# Patient Record
Sex: Male | Born: 1975 | Race: White | Hispanic: No | Marital: Single | Smoking: Never smoker
Health system: Southern US, Community
[De-identification: ages and names within clinical notes are randomized; demographics above are authoritative.]

## PROBLEM LIST (undated history)

## (undated) DIAGNOSIS — G4730 Sleep apnea, unspecified: Secondary | ICD-10-CM

## (undated) DIAGNOSIS — I10 Essential (primary) hypertension: Secondary | ICD-10-CM

## (undated) HISTORY — DX: Essential (primary) hypertension: I10

---

## 2014-11-27 ENCOUNTER — Ambulatory Visit (INDEPENDENT_AMBULATORY_CARE_PROVIDER_SITE_OTHER): Payer: BLUE CROSS/BLUE SHIELD | Admitting: Physician Assistant

## 2014-11-27 VITALS — BP 120/78 | HR 76 | Temp 97.7°F | Resp 16 | Ht 70.0 in | Wt 186.0 lb

## 2014-11-27 DIAGNOSIS — M545 Low back pain, unspecified: Secondary | ICD-10-CM

## 2014-11-27 DIAGNOSIS — M549 Dorsalgia, unspecified: Secondary | ICD-10-CM | POA: Insufficient documentation

## 2014-11-27 MED ORDER — BACLOFEN 10 MG PO TABS: 10.0000 mg | ORAL_TABLET | Freq: Two times a day (BID) | ORAL | Status: DC

## 2014-11-27 MED ORDER — PREDNISONE 20 MG PO TABS: ORAL_TABLET | ORAL | Status: DC

## 2014-11-27 MED ORDER — HYDROCODONE-ACETAMINOPHEN 5-325 MG PO TABS: 1.0000 | ORAL_TABLET | Freq: Four times a day (QID) | ORAL | Status: DC | PRN

## 2014-11-27 NOTE — Progress Notes (Signed)
   Subjective:    Patient ID: Dale Conley, male    DOB: Jul 09, 1976, 39 y.o.   MRN: [id number]  HPI 39 y.o. with history of back pain presents with back pain has not been seen since 2014. He has been sleeping on the couch due to new baby, no injury, tight yesterday morning and then got worse at work. Mid back pain, across, Patient denies fever, hematuria, incontinence, numbness, tingling, weakness and saddle anesthesia  Blood pressure 120/78, pulse 76, temperature 97.7 F (36.5 C), resp. rate 16, height 5\' 10"  (1.778 m), weight 186 lb (84.369 kg).  Review of Systems  Constitutional: Negative.  Negative for fever.  HENT: Negative.   Respiratory: Negative.   Cardiovascular: Negative.  Negative for chest pain.  Gastrointestinal: Negative.  Negative for abdominal pain.  Genitourinary: Negative.  Negative for dysuria.  Musculoskeletal: Positive for back pain and gait problem. Negative for myalgias, joint swelling, arthralgias, neck pain and neck stiffness.  Skin: Negative.   Neurological: Negative.  Negative for weakness, numbness and headaches.       Objective:   Physical Exam  Constitutional: He is oriented to person, place, and time. He appears well-developed and well-nourished.  Cardiovascular: Normal rate and regular rhythm.   Pulmonary/Chest: Effort normal and breath sounds normal.  Abdominal: Soft. Bowel sounds are normal.  Musculoskeletal:  Patient is able to ambulate well.   Gait is  antalgic. Straight leg raising with dorsiflexion negative bilaterally for radicular symptoms. Sensory exam in the legs are normal.  Knee reflexes are normal Ankle reflexes are normal Strength is normal and symmetric in arms and legs. There is not SI tenderness to palpation.  There isparaspinal muscle spasm.   There is midline tenderness.   ROM of spine with normal flexion, extension, lateral range of motion to the right and left, and rotation to the right and left.    Neurological: He is  alert and oriented to person, place, and time.       Assessment & Plan:  Lower back pain- negative straight leg Prednisone was prescribed,NSAIDs, RICE, and exercise given Will do baclofen, Norco 5 325, #60 NR If not better follow up in office or will refer to PT/orthopedics.  Schedule CPE in near future

## 2014-11-27 NOTE — Patient Instructions (Signed)
Back Injury Prevention Back injuries can be extremely painful and difficult to heal. After having one back injury, you are much more likely to experience another later on. It is important to learn how to avoid injuring or re-injuring your back. The following tips can help you to prevent a back injury. PHYSICAL FITNESS  Exercise regularly and try to develop good tone in your abdominal muscles. Your abdominal muscles provide a lot of the support needed by your back.  Do aerobic exercises (walking, jogging, biking, swimming) regularly.  Do exercises that increase balance and strength (tai chi, yoga) regularly. This can decrease your risk of falling and injuring your back.  Stretch before and after exercising.  Maintain a healthy weight. The more you weigh, the more stress is placed on your back. For every pound of weight, 10 times that amount of pressure is placed on the back. DIET  Talk to your caregiver about how much calcium and vitamin D you need per day. These nutrients help to prevent weakening of the bones (osteoporosis). Osteoporosis can cause broken (fractured) bones that lead to back pain.  Include good sources of calcium in your diet, such as dairy products, green, leafy vegetables, and products with calcium added (fortified).  Include good sources of vitamin D in your diet, such as milk and foods that are fortified with vitamin D.  Consider taking a nutritional supplement or a multivitamin if needed.  Stop smoking if you smoke. POSTURE  Sit and stand up straight. Avoid leaning forward when you sit or hunching over when you stand.  Choose chairs with good low back (lumbar) support.  If you work at a desk, sit close to your work so you do not need to lean over. Keep your chin tucked in. Keep your neck drawn back and elbows bent at a right angle. Your arms should look like the letter "L."  Sit high and close to the steering wheel when you drive. Add a lumbar support to your car  seat if needed.  Avoid sitting or standing in one position for too long. Take breaks to get up, stretch, and walk around at least once every hour. Take breaks if you are driving for long periods of time.  Sleep on your side with your knees slightly bent, or sleep on your back with a pillow under your knees. Do not sleep on your stomach. LIFTING, TWISTING, AND REACHING  Avoid heavy lifting, especially repetitive lifting. If you must do heavy lifting:  Stretch before lifting.  Work slowly.  Rest between lifts.  Use carts and dollies to move objects when possible.  Make several small trips instead of carrying 1 heavy load.  Ask for help when you need it.  Ask for help when moving big, awkward objects.  Follow these steps when lifting:  Stand with your feet shoulder-width apart.  Get as close to the object as you can. Do not try to pick up heavy objects that are far from your body.  Use handles or lifting straps if they are available.  Bend at your knees. Squat down, but keep your heels off the floor.  Keep your shoulders pulled back, your chin tucked in, and your back straight.  Lift the object slowly, tightening the muscles in your legs, abdomen, and buttocks. Keep the object as close to the center of your body as possible.  When you put a load down, use these same guidelines in reverse.  Do not:  Lift the object above your waist.    Twist at the waist while lifting or carrying a load. Move your feet if you need to turn, not your waist.  Bend over without bending at your knees.  Avoid reaching over your head, across a table, or for an object on a high surface. OTHER TIPS  Avoid wet floors and keep sidewalks clear of ice to prevent falls.  Do not sleep on a mattress that is too soft or too hard.  Keep items that are used frequently within easy reach.  Put heavier objects on shelves at waist level and lighter objects on lower or higher shelves.  Find ways to  decrease your stress, such as exercise, massage, or relaxation techniques. Stress can build up in your muscles. Tense muscles are more vulnerable to injury.  Seek treatment for depression or anxiety if needed. These conditions can increase your risk of developing back pain. SEEK MEDICAL CARE IF:  You injure your back.  You have questions about diet, exercise, or other ways to prevent back injuries. MAKE SURE YOU:  Understand these instructions.  Will watch your condition.  Will get help right away if you are not doing well or get worse. Document Released: 10/22/2004 Document Revised: 12/07/2011 Document Reviewed: 10/26/2011 [facility] Patient Information 2015 [place], [place]. This information is not intended to replace advice given to you by your health care provider. Make sure you discuss any questions you have with your health care provider. Back Exercises Back exercises help treat and prevent back injuries. The goal of back exercises is to increase the strength of your abdominal and back muscles and the flexibility of your back. These exercises should be started when you no longer have back pain. Back exercises include:  Pelvic Tilt. Lie on your back with your knees bent. Tilt your pelvis until the lower part of your back is against the floor. Hold this position 5 to 10 sec and repeat 5 to 10 times.  Knee to Chest. Pull first 1 knee up against your chest and hold for 20 to 30 seconds, repeat this with the other knee, and then both knees. This may be done with the other leg straight or bent, whichever feels better.  Sit-Ups or Curl-Ups. Bend your knees 90 degrees. Start with tilting your pelvis, and do a partial, slow sit-up, lifting your trunk only 30 to 45 degrees off the floor. Take at least 2 to 3 seconds for each sit-up. Do not do sit-ups with your knees out straight. If partial sit-ups are difficult, simply do the above but with only tightening your abdominal muscles and holding it as  directed.  Hip-Lift. Lie on your back with your knees flexed 90 degrees. Push down with your feet and shoulders as you raise your hips a couple inches off the floor; hold for 10 seconds, repeat 5 to 10 times.  Back arches. Lie on your stomach, propping yourself up on bent elbows. Slowly press on your hands, causing an arch in your low back. Repeat 3 to 5 times. Any initial stiffness and discomfort should lessen with repetition over time.  Shoulder-Lifts. Lie face down with arms beside your body. Keep hips and torso pressed to floor as you slowly lift your head and shoulders off the floor. Do not overdo your exercises, especially in the beginning. Exercises may cause you some mild back discomfort which lasts for a few minutes; however, if the pain is more severe, or lasts for more than 15 minutes, do not continue exercises until you see your caregiver. Improvement with  exercise therapy for back problems is slow.  See your caregivers for assistance with developing a proper back exercise program. Document Released: 10/22/2004 Document Revised: 12/07/2011 Document Reviewed: 07/16/2011 [facility] Patient Information 2015 [place], [place]. This information is not intended to replace advice given to you by your health care provider. Make sure you discuss any questions you have with your health care provider.

## 2015-05-01 ENCOUNTER — Ambulatory Visit (INDEPENDENT_AMBULATORY_CARE_PROVIDER_SITE_OTHER): Payer: BLUE CROSS/BLUE SHIELD | Admitting: Physician Assistant

## 2015-05-01 ENCOUNTER — Encounter: Payer: Self-pay | Admitting: Physician Assistant

## 2015-05-01 VITALS — BP 146/98 | HR 88 | Temp 97.7°F | Resp 16 | Ht 69.0 in | Wt 191.4 lb

## 2015-05-01 DIAGNOSIS — R5383 Other fatigue: Secondary | ICD-10-CM

## 2015-05-01 DIAGNOSIS — I10 Essential (primary) hypertension: Secondary | ICD-10-CM | POA: Diagnosis not present

## 2015-05-01 DIAGNOSIS — Z0000 Encounter for general adult medical examination without abnormal findings: Secondary | ICD-10-CM

## 2015-05-01 DIAGNOSIS — Z79899 Other long term (current) drug therapy: Secondary | ICD-10-CM | POA: Diagnosis not present

## 2015-05-01 DIAGNOSIS — E559 Vitamin D deficiency, unspecified: Secondary | ICD-10-CM

## 2015-05-01 DIAGNOSIS — M545 Low back pain, unspecified: Secondary | ICD-10-CM

## 2015-05-01 DIAGNOSIS — M26609 Unspecified temporomandibular joint disorder, unspecified side: Secondary | ICD-10-CM

## 2015-05-01 LAB — CBC WITH DIFFERENTIAL/PLATELET
Basophils Absolute: 0.1 10*3/uL (ref 0.0–0.1)
Basophils Relative: 1 % (ref 0–1)
EOS PCT: 3 % (ref 0–5)
Eosinophils Absolute: 0.2 10*3/uL (ref 0.0–0.7)
HEMATOCRIT: 43.1 % (ref 39.0–52.0)
HEMOGLOBIN: 14.7 g/dL (ref 13.0–17.0)
Lymphocytes Relative: 43 % (ref 12–46)
Lymphs Abs: 3.6 10*3/uL (ref 0.7–4.0)
MCH: 31.5 pg (ref 26.0–34.0)
MCHC: 34.1 g/dL (ref 30.0–36.0)
MCV: 92.3 fL (ref 78.0–100.0)
MONOS PCT: 7 % (ref 3–12)
MPV: 8.8 fL (ref 8.6–12.4)
Monocytes Absolute: 0.6 10*3/uL (ref 0.1–1.0)
Neutro Abs: 3.8 10*3/uL (ref 1.7–7.7)
Neutrophils Relative %: 46 % (ref 43–77)
Platelets: 249 10*3/uL (ref 150–400)
RBC: 4.67 MIL/uL (ref 4.22–5.81)
RDW: 12.8 % (ref 11.5–15.5)
WBC: 8.3 10*3/uL (ref 4.0–10.5)

## 2015-05-01 LAB — HEMOGLOBIN A1C
HEMOGLOBIN A1C: 5.5 % (ref <5.7)
MEAN PLASMA GLUCOSE: 111 mg/dL (ref <117)

## 2015-05-01 MED ORDER — TRAMADOL HCL 50 MG PO TABS: ORAL_TABLET | ORAL | Status: DC

## 2015-05-01 NOTE — Progress Notes (Signed)
Complete Physical  Assessment and Plan: 1. Routine general medical examination at a health care facility - CBC with Differential/Platelet - BASIC METABOLIC PANEL WITH GFR - Hepatic function panel - TSH - Lipid panel - Hemoglobin A1c - Insulin, fasting - Magnesium - Vit D  25 hydroxy (rtn osteoporosis monitoring) - Urinalysis, Routine w reflex microscopic (not at [facility]) - Microalbumin / creatinine urine ratio - Iron and TIBC - Ferritin - Vitamin B12 - Testosterone - EKG 12-Lead  2. Essential hypertension - monitor at home, recheck was 140/90, call the office if continually above 140/90, will follow up 1 month. DASH diet, exercise and monitor at home.  - CBC with Differential/Platelet - BASIC METABOLIC PANEL WITH GFR - Hepatic function panel - Lipid panel - Urinalysis, Routine w reflex microscopic (not at [facility]) - Microalbumin / creatinine urine ratio - EKG 12-Lead  3. Vitamin D deficiency - Vit D  25 hydroxy (rtn osteoporosis monitoring)  4. Medication management - Magnesium  5. Other fatigue Fatigue- Discussed lifestyle modification as means of resolving problem, Training modifications discussed, See orders for lab evaluation, Discussed how depression can be a cause of fatigue if all labs are negative discussed depression treatment, will start on celexa  if labs negative, follow up 1 month - TSH - Hemoglobin A1c - Insulin, fasting - Iron and TIBC - Ferritin - Vitamin B12 - Testosterone  6. Bilateral low back pain without sciatica Suggest PT - traMADol (ULTRAM) 50 MG tablet; 1 pill twice daily as needed for pain  Dispense: 60 tablet; Refill: 0  7. TMJ (temporomandibular joint syndrome) information given to the patient, no gum/decrease hard foods, warm wet wash clothes, decrease stress, talk with dentist about possible night guard, can do massage, and exercise.   Discussed med's effects and SE's. Screening labs and tests as requested with regular follow-up as  recommended. Over 40 minutes of exam, counseling, chart review and critical decision making was performed   HPI  This very nice 39 y.o.male presents for complete physical.  Patient has no major health issues.   He does not workout anymore due to heat and new baby. He has a history of elevated BP, worse when he comes to the doctors, has checked at home and it is okay, BP today is BP: (!) 146/98 mmHg  Finally, patient has history of Vitamin D Deficiency and last vitamin D was  Currently on supplementation.  Married with new baby girl, she is 50 months old, [place], [place]. Works for [employer], travels a lot, is in the car a lot.  Continuing lower back occ, has seen chiropractor which helped some.  He has been having fatigue x 1 year, worse with birth of his child. He sleeps 5-6 hours a night and wakes up frequently in the night, for last few years. Admits that he has been more anxious/depressed recently. He works, married, in school for [employer], and new baby.  BMI is Body mass index is 28.25 kg/(m^2)., he is working on diet and exercise. Wt Readings from Last 3 Encounters:  05/01/15 191 lb 6.4 oz (86.818 kg)  11/27/14 186 lb (84.369 kg)    Current Medications:  Current Outpatient Prescriptions on File Prior to Visit  Medication Sig Dispense Refill  . baclofen (LIORESAL) 10 MG tablet Take 1 tablet (10 mg total) by mouth 2 (two) times daily. 60 tablet 0   No current facility-administered medications on file prior to visit.   Health Maintenance:   TD/TDAP: 2015 Influenza: 2014 Pneumovax: N/A Prevnar  13: N/A  Sexually Active: yes, wife  Last Dental Exam: Dr. Ninetta Lights, q 6 months Last Eye Exam: None  Allergies:  Allergies  Allergen Reactions  . Flexeril [Cyclobenzaprine]    Medical History:  Past Medical History  Diagnosis Date  . Hypertension    Surgical History: No past surgical history on file. Family History:  Family History  Problem Relation Age of Onset  . Heart  disease Father 48    CABG  . Hypertension Father   . Hyperlipidemia Father   . Diabetes Paternal Grandfather    Social History:  History  Substance Use Topics  . Smoking status: Never Smoker   . Smokeless tobacco: Never Used  . Alcohol Use: 8.4 oz/week    14 Standard drinks or equivalent per week     Comment: beer   Review of Systems: Review of Systems  Constitutional: Positive for malaise/fatigue. Negative for fever, chills, weight loss and diaphoresis.  HENT: Negative for congestion, ear discharge, ear pain, hearing loss, nosebleeds, sore throat and tinnitus.   Eyes: Negative.   Respiratory: Negative.  Negative for stridor.   Cardiovascular: Negative.   Gastrointestinal: Positive for heartburn. Negative for nausea, vomiting, abdominal pain, diarrhea, constipation, blood in stool and melena.  Genitourinary: Negative.   Musculoskeletal: Positive for back pain. Negative for myalgias, joint pain, falls and neck pain.  Skin: Negative.   Neurological: Positive for headaches (has TMJ, needs mouth guard). Negative for dizziness, tingling, tremors, sensory change, speech change, focal weakness, seizures, loss of consciousness and weakness.  Endo/Heme/Allergies: Negative.   Psychiatric/Behavioral: Positive for depression. Negative for suicidal ideas, hallucinations, memory loss and substance abuse. The patient is nervous/anxious and has insomnia.    Physical Exam: Estimated body mass index is 28.25 kg/(m^2) as calculated from the following:   Height as of this encounter: 5\' 9"  (1.753 m).   Weight as of this encounter: 191 lb 6.4 oz (86.818 kg). BP 146/98 mmHg  Pulse 88  Temp(Src) 97.7 F (36.5 C)  Resp 16  Ht 5\' 9"  (1.753 m)  Wt 191 lb 6.4 oz (86.818 kg)  BMI 28.25 kg/m2 General Appearance: Well nourished, in no apparent distress.  Eyes: PERRLA, EOMs, conjunctiva no swelling or erythema, normal fundi and vessels.  Sinuses: No Frontal/maxillary tenderness  ENT/Mouth: Ext aud  canals clear, normal light reflex with TMs without erythema, bulging. Good dentition. No erythema, swelling, or exudate on post pharynx. Tonsils not swollen or erythematous. Hearing normal.  Neck: Supple, thyroid normal. No bruits  Respiratory: Respiratory effort normal, BS equal bilaterally without rales, rhonchi, wheezing or stridor.  Cardio: RRR without murmurs, rubs or gallops. Brisk peripheral pulses without edema.  Chest: symmetric, with normal excursions and percussion.  Abdomen: Soft, nontender, no guarding, rebound, hernias, masses, or organomegaly.  Lymphatics: Non tender without lymphadenopathy.  Genitourinary: defer Musculoskeletal: Full ROM all peripheral extremities,5/5 strength, and normal gait.  Skin: Warm, dry without rashes, lesions, ecchymosis. Neuro: Cranial nerves intact, reflexes equal bilaterally. Normal muscle tone, no cerebellar symptoms. Sensation intact.  Psych: Awake and oriented X 3, normal affect, Insight and Judgment appropriate.   EKG: WNL  Quentin Mulling 2:41 PM [facility]

## 2015-05-01 NOTE — Patient Instructions (Addendum)
9 Ways to Naturally Increase Testosterone Levels  1.   Lose Weight If you're overweight, shedding the excess pounds may increase your testosterone levels, according to research presented at the [employer] Society's 2012 meeting. Overweight men are more likely to have low testosterone levels to begin with, so this is an important trick to increase your body's testosterone production when you need it most.  2.   High-Intensity Exercise like Peak [employer]itness  Short intense exercise has a proven positive effect on increasing testosterone levels and preventing its decline. That's unlike aerobics or prolonged moderate exercise, which have shown to have negative or no effect on testosterone levels. Having a whey protein meal after exercise can further enhance the satiety/testosterone-boosting impact (hunger hormones cause the opposite effect on your testosterone and libido). Here's a summary of what a typical high-intensity [employer]itness routine might look like: " Warm up for three minutes  " Exercise as hard and fast as you can for 30 seconds. You should feel like you couldn't possibly go on another few seconds  " Recover at a slow to moderate pace for 90 seconds  " Repeat the high intensity exercise and recovery 7 more times .  3.   Consume Plenty of Zinc The mineral zinc is important for testosterone production, and supplementing your diet for as little as six weeks has been shown to cause a marked improvement in testosterone among men with low levels.1 Likewise, research has shown that restricting dietary sources of zinc leads to a significant decrease in testosterone, while zinc supplementation increases it2 -- and even protects men from exercised-induced reductions in testosterone levels.3 It's estimated that up to 45 percent of adults over the age of 60 may have lower than recommended zinc intakes; even when dietary supplements were added in, an estimated 20-25 percent of older adults still had inadequate  zinc intakes, according to a National Health and Nutrition Examination Survey.4 Your diet is the best source of zinc; along with protein-rich foods like meats and fish, other good dietary sources of zinc include raw milk, raw cheese, beans, and yogurt or kefir made from raw milk. It can be difficult to obtain enough dietary zinc if you're a vegetarian, and also for meat-eaters as well, largely because of conventional farming methods that rely heavily on chemical fertilizers and pesticides. These chemicals deplete the soil of nutrients ... nutrients like zinc that must be absorbed by plants in order to be passed on to you. In many cases, you may further deplete the nutrients in your food by the way you prepare it. For most food, cooking it will drastically reduce its levels of nutrients like zinc . particularly over-cooking, which many people do. If you decide to use a zinc supplement, stick to a dosage of less than 40 mg a day, as this is the recommended adult upper limit. Taking too much zinc can interfere with your body's ability to absorb other minerals, especially copper, and may cause nausea as a side effect.  4.   Strength Training In addition to [employer], strength training is also known to boost testosterone levels, provided you are doing so intensely enough. When strength training to boost testosterone, you'll want to increase the weight and lower your number of reps, and then focus on exercises that work a large number of muscles, such as dead lifts or squats.  You can "turbo-charge" your weight training by going slower. By slowing down your movement, you're actually turning it into a high-intensity exercise. Super Slow   movement allows your muscle, at the microscopic level, to access the maximum number of cross-bridges between the protein filaments that produce movement in the muscle.   5.   Optimize Your Vitamin D Levels Vitamin D, a steroid hormone, is essential for the healthy development  of the nucleus of the sperm cell, and helps maintain semen quality and sperm count. Vitamin D also increases levels of testosterone, which may boost libido. In one study, overweight men who were given vitamin D supplements had a significant increase in testosterone levels after one year.5   6.   Reduce Stress When you're under a lot of stress, your body releases high levels of the stress hormone cortisol. This hormone actually blocks the effects of testosterone,6 presumably because, from a biological standpoint, testosterone-associated behaviors (mating, competing, aggression) may have lowered your chances of survival in an emergency (hence, the "fight or flight" response is dominant, courtesy of cortisol).  7.   Limit or Eliminate Sugar from Your Diet Testosterone levels decrease after you eat sugar, which is likely because the sugar leads to a high insulin level, another factor leading to low testosterone.7 Based on USDA estimates, the average American consumes 12 teaspoons of sugar a day, which equates to about TWO TONS of sugar during a lifetime.  8.   Eat Healthy Fats By healthy, this means not only mon- and polyunsaturated fats, like that found in avocadoes and nuts, but also saturated, as these are essential for building testosterone. Research shows that a diet with less than 40 percent of energy as fat (and that mainly from animal sources, i.e. saturated) lead to a decrease in testosterone levels.8 My personal diet is about 60-70 percent healthy fat, and other experts agree that the ideal diet includes somewhere between 50-70 percent fat.  It's important to understand that your body requires saturated fats from animal and vegetable sources (such as meat, dairy, certain oils, and tropical plants like coconut) for optimal functioning, and if you neglect this important food group in favor of sugar, grains and other starchy carbs, your health and weight are almost guaranteed to suffer. Examples of  healthy fats you can eat more of to give your testosterone levels a boost include: Olives and Olive oil  Coconuts and coconut oil Butter made from raw grass-fed organic milk Raw nuts, such as, almonds or pecans Organic pastured egg yolks Avocados Grass-fed meats Palm oil Unheated organic nut oils   9.   Boost Your Intake of Branch Chain Amino Acids (BCAA) from Foods Like Whey Protein Research suggests that BCAAs result in higher testosterone levels, particularly when taken along with resistance training.9 While BCAAs are available in supplement form, you'll find the highest concentrations of BCAAs like leucine in dairy products - especially quality cheeses and whey protein. Even when getting leucine from your natural food supply, it's often wasted or used as a building block instead of an anabolic agent. So to create the correct anabolic environment, you need to boost leucine consumption way beyond mere maintenance levels. That said, keep in mind that using leucine as a free form amino acid can be highly counterproductive as when free form amino acids are artificially administrated, they rapidly enter your circulation while disrupting insulin function, and impairing your body's glycemic control. Food-based leucine is really the ideal form that can benefit your muscles without side effects.   What is the TMJ? The temporomandibular (tem-PUH-ro-man-DIB-yoo-ler) joint, or the TMJ, connects the upper and lower jawbones. This joint allows the jaw to open  wide and move back and forth when you chew, talk, or yawn.There are also several muscles that help this joint move. There can be muscle tightness and pain in the muscle that can cause several symptoms.  What causes TMJ pain? There are many causes of TMJ pain. Repeated chewing (for example, chewing gum) and clenching your teeth can cause pain in the joint. Some TMJ pain has no obvious cause. What can I do to ease the pain? There are many things you can do  to help your pain get better. When you have pain:  Eat soft foods and stay away from chewy foods (for example, taffy) Try to use both sides of your mouth to chew Don't chew gum Massage Don't open your mouth wide (for example, during yawning or singing) Don't bite your cheeks or fingernails Lower your amount of stress and worry Applying a warm, damp washcloth to the joint may help. Over-the-counter pain medicines such as ibuprofen (one brand: Advil) or acetaminophen (one brand: Tylenol) might also help. Do not use these medicines if you are allergic to them or if your doctor told you not to use them. How can I stop the pain from coming back? When your pain is better, you can do these exercises to make your muscles stronger and to keep the pain from coming back:  Resisted mouth opening: Place your thumb or two fingers under your chin and open your mouth slowly, pushing up lightly on your chin with your thumb. Hold for three to six seconds. Close your mouth slowly. Resisted mouth closing: Place your thumbs under your chin and your two index fingers on the ridge between your mouth and the bottom of your chin. Push down lightly on your chin as you close your mouth. Tongue up: Slowly open and close your mouth while keeping the tongue touching the roof of the mouth. Side-to-side jaw movement: Place an object about one fourth of an inch thick (for example, two tongue depressors) between your front teeth. Slowly move your jaw from side to side. Increase the thickness of the object as the exercise becomes easier Forward jaw movement: Place an object about one fourth of an inch thick between your front teeth and move the bottom jaw forward so that the bottom teeth are in front of the top teeth. Increase the thickness of the object as the exercise becomes easier. These exercises should not be painful. If it hurts to do these exercises, stop doing them and talk to your family doctor.    Monitor your blood  pressure at home, if it is above 140/90 consistently call the office so we can adjust your medications. Go to the ER if any CP, SOB, nausea, dizziness, severe HA, changes vision/speech  DASH Eating Plan DASH stands for "Dietary Approaches to Stop Hypertension." The DASH eating plan is a healthy eating plan that has been shown to reduce high blood pressure (hypertension). Additional health benefits may include reducing the risk of type 2 diabetes mellitus, heart disease, and stroke. The DASH eating plan may also help with weight loss. WHAT DO I NEED TO KNOW ABOUT THE DASH EATING PLAN? For the DASH eating plan, you will follow these general guidelines:  Choose foods with a percent daily value for sodium of less than 5% (as listed on the food label).  Use salt-free seasonings or herbs instead of table salt or sea salt.  Check with your health care provider or pharmacist before using salt substitutes.  Eat lower-sodium products, often labeled  as "lower sodium" or "no salt added."  Eat fresh foods.  Eat more vegetables, fruits, and low-fat dairy products.  Choose whole grains. Look for the word "whole" as the first word in the ingredient list.  Choose fish and skinless chicken or [place] more often than red meat. Limit fish, poultry, and meat to 6 oz (170 g) each day.  Limit sweets, desserts, sugars, and sugary drinks.  Choose heart-healthy fats.  Limit cheese to 1 oz (28 g) per day.  Eat more home-cooked food and less restaurant, buffet, and fast food.  Limit fried foods.  Cook foods using methods other than frying.  Limit canned vegetables. If you do use them, rinse them well to decrease the sodium.  When eating at a restaurant, ask that your food be prepared with less salt, or no salt if possible. WHAT FOODS CAN I EAT? Seek help from a dietitian for individual calorie needs. Grains Whole grain or whole wheat bread. Brown rice. Whole grain or whole wheat pasta. Quinoa, bulgur,  and whole grain cereals. Low-sodium cereals. Corn or whole wheat flour tortillas. Whole grain cornbread. Whole grain crackers. Low-sodium crackers. Vegetables Fresh or frozen vegetables (raw, steamed, roasted, or grilled). Low-sodium or reduced-sodium tomato and vegetable juices. Low-sodium or reduced-sodium tomato sauce and paste. Low-sodium or reduced-sodium canned vegetables.  Fruits All fresh, canned (in natural juice), or frozen fruits. Meat and Other Protein Products Ground beef (85% or leaner), grass-fed beef, or beef trimmed of fat. Skinless chicken or [place]. Ground chicken or [place]. Pork trimmed of fat. All fish and seafood. Eggs. Dried beans, peas, or lentils. Unsalted nuts and seeds. Unsalted canned beans. Dairy Low-fat dairy products, such as skim or 1% milk, 2% or reduced-fat cheeses, low-fat ricotta or cottage cheese, or plain low-fat yogurt. Low-sodium or reduced-sodium cheeses. Fats and Oils Tub margarines without trans fats. Light or reduced-fat mayonnaise and salad dressings (reduced sodium). Avocado. Safflower, olive, or canola oils. Natural peanut or almond butter. Other Unsalted popcorn and pretzels. The items listed above may not be a complete list of recommended foods or beverages. Contact your dietitian for more options. WHAT FOODS ARE NOT RECOMMENDED? Grains White bread. White pasta. White rice. Refined cornbread. Bagels and croissants. Crackers that contain trans fat. Vegetables Creamed or fried vegetables. Vegetables in a cheese sauce. Regular canned vegetables. Regular canned tomato sauce and paste. Regular tomato and vegetable juices. Fruits Dried fruits. Canned fruit in light or heavy syrup. Fruit juice. Meat and Other Protein Products Fatty cuts of meat. Ribs, chicken wings, bacon, sausage, bologna, salami, chitterlings, fatback, hot dogs, bratwurst, and packaged luncheon meats. Salted nuts and seeds. Canned beans with salt. Dairy Whole or 2% milk, cream,  half-and-half, and cream cheese. Whole-fat or sweetened yogurt. Full-fat cheeses or blue cheese. Nondairy creamers and whipped toppings. Processed cheese, cheese spreads, or cheese curds. Condiments Onion and garlic salt, seasoned salt, table salt, and sea salt. Canned and packaged gravies. Worcestershire sauce. Tartar sauce. Barbecue sauce. Teriyaki sauce. Soy sauce, including reduced sodium. Steak sauce. Fish sauce. Oyster sauce. Cocktail sauce. Horseradish. Ketchup and mustard. Meat flavorings and tenderizers. Bouillon cubes. Hot sauce. Tabasco sauce. Marinades. Taco seasonings. Relishes. Fats and Oils Butter, stick margarine, lard, shortening, ghee, and bacon fat. Coconut, palm kernel, or palm oils. Regular salad dressings. Other Pickles and olives. Salted popcorn and pretzels. The items listed above may not be a complete list of foods and beverages to avoid. Contact your dietitian for more information. WHERE CAN I FIND MORE INFORMATION? National Heart,  Lung, and Blood Institute: travelstabloid.com Document Released: 09/03/2011 Document Revised: 01/29/2014 Document Reviewed: 07/19/2013 [facility] Patient Information 2015 [place], [place]. This information is not intended to replace advice given to you by your health care provider. Make sure you discuss any questions you have with your health care provider.

## 2015-05-02 LAB — URINALYSIS, ROUTINE W REFLEX MICROSCOPIC
Bilirubin Urine: NEGATIVE
GLUCOSE, UA: NEGATIVE
Hgb urine dipstick: NEGATIVE
Ketones, ur: NEGATIVE
Leukocytes, UA: NEGATIVE
NITRITE: NEGATIVE
PH: 6.5 (ref 5.0–8.0)
Protein, ur: NEGATIVE
SPECIFIC GRAVITY, URINE: 1.021 (ref 1.001–1.035)

## 2015-05-02 LAB — HEPATIC FUNCTION PANEL
ALK PHOS: 68 U/L (ref 40–115)
ALT: 24 U/L (ref 9–46)
AST: 25 U/L (ref 10–40)
Albumin: 4.2 g/dL (ref 3.6–5.1)
BILIRUBIN DIRECT: 0.1 mg/dL (ref <=0.2)
Indirect Bilirubin: 0.4 mg/dL (ref 0.2–1.2)
Total Bilirubin: 0.5 mg/dL (ref 0.2–1.2)
Total Protein: 7.2 g/dL (ref 6.1–8.1)

## 2015-05-02 LAB — IRON AND TIBC
%SAT: 36 % (ref 20–55)
Iron: 146 ug/dL (ref 42–165)
TIBC: 402 ug/dL (ref 215–435)
UIBC: 256 ug/dL (ref 125–400)

## 2015-05-02 LAB — LIPID PANEL
Cholesterol: 218 mg/dL — ABNORMAL HIGH (ref 125–200)
HDL: 81 mg/dL (ref >=40)
LDL Cholesterol: 114 mg/dL (ref <130)
Total CHOL/HDL Ratio: 2.7 Ratio (ref <=5.0)
Triglycerides: 115 mg/dL (ref <150)
VLDL: 23 mg/dL (ref <30)

## 2015-05-02 LAB — MICROALBUMIN / CREATININE URINE RATIO
Creatinine, Urine: 165.1 mg/dL
MICROALB UR: 1.1 mg/dL (ref <2.0)
Microalb Creat Ratio: 6.7 mg/g (ref 0.0–30.0)

## 2015-05-02 LAB — FERRITIN: FERRITIN: 164 ng/mL (ref 22–322)

## 2015-05-02 LAB — BASIC METABOLIC PANEL WITH GFR
BUN: 15 mg/dL (ref 7–25)
CALCIUM: 9.6 mg/dL (ref 8.6–10.3)
CO2: 26 mmol/L (ref 20–31)
Chloride: 99 mmol/L (ref 98–110)
Creat: 0.92 mg/dL (ref 0.60–1.35)
GFR, Est African American: >89 mL/min (ref >=60)
Glucose, Bld: 81 mg/dL (ref 65–99)
Potassium: 3.9 mmol/L (ref 3.5–5.3)
Sodium: 138 mmol/L (ref 135–146)

## 2015-05-02 LAB — MAGNESIUM: MAGNESIUM: 2 mg/dL (ref 1.5–2.5)

## 2015-05-02 LAB — VITAMIN B12: Vitamin B-12: 607 pg/mL (ref 211–911)

## 2015-05-02 LAB — VITAMIN D 25 HYDROXY (VIT D DEFICIENCY, FRACTURES): VIT D 25 HYDROXY: 26 ng/mL — AB (ref 30–100)

## 2015-05-02 LAB — TESTOSTERONE: Testosterone: 115 ng/dL — ABNORMAL LOW (ref 300–890)

## 2015-05-02 LAB — INSULIN, FASTING: Insulin fasting, serum: 7.2 u[IU]/mL (ref 2.0–19.6)

## 2015-05-02 LAB — TSH: TSH: 0.89 u[IU]/mL (ref 0.350–4.500)

## 2015-05-02 MED ORDER — CITALOPRAM HYDROBROMIDE 20 MG PO TABS: ORAL_TABLET | ORAL | Status: DC

## 2015-05-02 NOTE — Addendum Note (Signed)
Addended by: Quentin Mulling R on: 05/02/2015 12:56 PM   Modules accepted: Orders, SmartSet

## 2015-08-05 ENCOUNTER — Ambulatory Visit (INDEPENDENT_AMBULATORY_CARE_PROVIDER_SITE_OTHER): Payer: BLUE CROSS/BLUE SHIELD | Admitting: Physician Assistant

## 2015-08-05 ENCOUNTER — Encounter: Payer: Self-pay | Admitting: Physician Assistant

## 2015-08-05 VITALS — BP 126/90 | HR 83 | Temp 97.7°F | Resp 16 | Ht 69.0 in | Wt 187.0 lb

## 2015-08-05 DIAGNOSIS — E559 Vitamin D deficiency, unspecified: Secondary | ICD-10-CM | POA: Diagnosis not present

## 2015-08-05 DIAGNOSIS — E291 Testicular hypofunction: Secondary | ICD-10-CM | POA: Diagnosis not present

## 2015-08-05 DIAGNOSIS — R5383 Other fatigue: Secondary | ICD-10-CM

## 2015-08-05 DIAGNOSIS — M545 Low back pain, unspecified: Secondary | ICD-10-CM

## 2015-08-05 MED ORDER — PHOSPHATIDYLSERINE-DHA-EPA 75-21.5-8.5 MG PO CAPS: ORAL_CAPSULE | ORAL | Status: DC

## 2015-08-05 MED ORDER — BACLOFEN 10 MG PO TABS: 10.0000 mg | ORAL_TABLET | Freq: Two times a day (BID) | ORAL | Status: DC

## 2015-08-05 MED ORDER — VITAMIN D (ERGOCALCIFEROL) 1.25 MG (50000 UNIT) PO CAPS: ORAL_CAPSULE | ORAL | Status: DC

## 2015-08-05 MED ORDER — BUPROPION HCL ER (XL) 150 MG PO TB24: 150.0000 mg | ORAL_TABLET | ORAL | Status: DC

## 2015-08-05 MED ORDER — TESTOSTERONE CYPIONATE 200 MG/ML IM SOLN: INTRAMUSCULAR | Status: DC

## 2015-08-05 MED ORDER — TRAMADOL HCL 50 MG PO TABS: ORAL_TABLET | ORAL | Status: DC

## 2015-08-05 NOTE — Patient Instructions (Addendum)
Stop the celexa  Start on the wellburin, if you do not see a difference in 1 month with your energy/concentration with this medicine stop it.    Take the vitamin d 2 capsules a week  The testosterone will start on you 1.5 cc intramuscularly every 2 weeks,then we will want to do a lab only for your testosterone after 2 injections when you are due.  We usually use 21 G, 1 inch needles since it is thick.   9 Ways to Naturally Increase Testosterone Levels  1.   Lose Weight If you're overweight, shedding the excess pounds may increase your testosterone levels, according to research presented at the Endocrine Society's 2012 meeting. Overweight men are more likely to have low testosterone levels to begin with, so this is an important trick to increase your body's testosterone production when you need it most.  2.   High-Intensity Exercise like Peak Fitness  Short intense exercise has a proven positive effect on increasing testosterone levels and preventing its decline. That's unlike aerobics or prolonged moderate exercise, which have shown to have negative or no effect on testosterone levels. Having a whey protein meal after exercise can further enhance the satiety/testosterone-boosting impact (hunger hormones cause the opposite effect on your testosterone and libido). Here's a summary of what a typical high-intensity [employer]itness routine might look like: " Warm up for three minutes  " Exercise as hard and fast as you can for 30 seconds. You should feel like you couldn't possibly go on another few seconds  " Recover at a slow to moderate pace for 90 seconds  " Repeat the high intensity exercise and recovery 7 more times .  3.   Consume Plenty of Zinc The mineral zinc is important for testosterone production, and supplementing your diet for as little as six weeks has been shown to cause a marked improvement in testosterone among men with low levels.1 Likewise, research has shown that restricting  dietary sources of zinc leads to a significant decrease in testosterone, while zinc supplementation increases it2 -- and even protects men from exercised-induced reductions in testosterone levels.3 It's estimated that up to 45 percent of adults over the age of 60 may have lower than recommended zinc intakes; even when dietary supplements were added in, an estimated 20-25 percent of older adults still had inadequate zinc intakes, according to a [employer] and Nutrition Examination Survey.4 Your diet is the best source of zinc; along with protein-rich foods like meats and fish, other good dietary sources of zinc include raw milk, raw cheese, beans, and yogurt or kefir made from raw milk. It can be difficult to obtain enough dietary zinc if you're a vegetarian, and also for meat-eaters as well, largely because of conventional farming methods that rely heavily on chemical fertilizers and pesticides. These chemicals deplete the soil of nutrients ... nutrients like zinc that must be absorbed by plants in order to be passed on to you. In many cases, you may further deplete the nutrients in your food by the way you prepare it. For most food, cooking it will drastically reduce its levels of nutrients like zinc . particularly over-cooking, which many people do. If you decide to use a zinc supplement, stick to a dosage of less than 40 mg a day, as this is the recommended adult upper limit. Taking too much zinc can interfere with your body's ability to absorb other minerals, especially copper, and may cause nausea as a side effect.  4.   Strength  Training In addition to [employer], strength training is also known to boost testosterone levels, provided you are doing so intensely enough. When strength training to boost testosterone, you'll want to increase the weight and lower your number of reps, and then focus on exercises that work a large number of muscles, such as dead lifts or squats.  You can "turbo-charge"  your weight training by going slower. By slowing down your movement, you're actually turning it into a high-intensity exercise. Super Slow movement allows your muscle, at the microscopic level, to access the maximum number of cross-bridges between the protein filaments that produce movement in the muscle.   5.   Optimize Your Vitamin D Levels Vitamin D, a steroid hormone, is essential for the healthy development of the nucleus of the sperm cell, and helps maintain semen quality and sperm count. Vitamin D also increases levels of testosterone, which may boost libido. In one study, overweight men who were given vitamin D supplements had a significant increase in testosterone levels after one year.5   6.   Reduce Stress When you're under a lot of stress, your body releases high levels of the stress hormone cortisol. This hormone actually blocks the effects of testosterone,6 presumably because, from a biological standpoint, testosterone-associated behaviors (mating, competing, aggression) may have lowered your chances of survival in an emergency (hence, the "fight or flight" response is dominant, courtesy of cortisol).  7.   Limit or Eliminate Sugar from Your Diet Testosterone levels decrease after you eat sugar, which is likely because the sugar leads to a high insulin level, another factor leading to low testosterone.7 Based on USDA estimates, the average American consumes 12 teaspoons of sugar a day, which equates to about TWO TONS of sugar during a lifetime.  8.   Eat Healthy Fats By healthy, this means not only mon- and polyunsaturated fats, like that found in avocadoes and nuts, but also saturated, as these are essential for building testosterone. Research shows that a diet with less than 40 percent of energy as fat (and that mainly from animal sources, i.e. saturated) lead to a decrease in testosterone levels.8 My personal diet is about 60-70 percent healthy fat, and other experts agree that the  ideal diet includes somewhere between 50-70 percent fat.  It's important to understand that your body requires saturated fats from animal and vegetable sources (such as meat, dairy, certain oils, and tropical plants like coconut) for optimal functioning, and if you neglect this important food group in favor of sugar, grains and other starchy carbs, your health and weight are almost guaranteed to suffer. Examples of healthy fats you can eat more of to give your testosterone levels a boost include: Olives and Olive oil  Coconuts and coconut oil Butter made from raw grass-fed organic milk Raw nuts, such as, almonds or pecans Organic pastured egg yolks Avocados Grass-fed meats Palm oil Unheated organic nut oils   9.   Boost Your Intake of Branch Chain Amino Acids (BCAA) from Foods Like Whey Protein Research suggests that BCAAs result in higher testosterone levels, particularly when taken along with resistance training.9 While BCAAs are available in supplement form, you'll find the highest concentrations of BCAAs like leucine in dairy products - especially quality cheeses and whey protein. Even when getting leucine from your natural food supply, it's often wasted or used as a building block instead of an anabolic agent. So to create the correct anabolic environment, you need to boost leucine consumption way beyond mere maintenance levels.  That said, keep in mind that using leucine as a free form amino acid can be highly counterproductive as when free form amino acids are artificially administrated, they rapidly enter your circulation while disrupting insulin function, and impairing your body's glycemic control. Food-based leucine is really the ideal form that can benefit your muscles without side effects.

## 2015-08-05 NOTE — Progress Notes (Signed)
Assessment and Plan:  1. Hypertension -Continue medication, monitor blood pressure at home. Continue DASH diet.  Reminder to go to the ER if any CP, SOB, nausea, dizziness, severe HA, changes vision/speech, left arm numbness and tingling and jaw pain.  2. Hypogonadism Check the testosterone, if low will start testosterone  3. ADD Stop the celexa, switch to wellbutrin, will start ADHD supplement Declines adderall due to addicitive nature  4. Back pain Refill tramadol/baclofen  5. Vitamin D Start Vitamin D RX, 2-3 times a week.   Continue diet and meds as discussed. Further disposition pending results of labs. Over 30 minutes of exam, counseling, chart review, and critical decision making was performed  HPI 39 y.o. male  presents for 3 month follow up on hypertension, cholesterol, prediabetes, and vitamin D deficiency.   His blood pressure has been controlled at home, today their BP is BP: 126/90 mmHg  He does not workout. He denies chest pain, shortness of breath, dizziness.  He is not on cholesterol medication and denies myalgias. His cholesterol is at goal. The cholesterol last visit was: Lab Results  Component Value Date   CHOL 218* 05/01/2015   HDL 81 05/01/2015   LDLCALC 114 05/01/2015   TRIG 115 05/01/2015   CHOLHDL 2.7 05/01/2015   Last V4U in the office was:  Lab Results  Component Value Date   HGBA1C 5.5 05/01/2015   Patient is on Vitamin D supplement.   Lab Results  Component Value Date   VD25OH 34* 05/01/2015     He has new baby girl, 10 months ago, he is married and in [place] school. Has ADD issues, and wife is vet and has her own clinic, and he has been having more responsibilities.  He has a history of testosterone deficiency and is on testosterone replacement. He states that the testosterone helps with his energy, libido, muscle mass. Lab Results  Component Value Date   TESTOSTERONE 115* 05/01/2015   He was started on celexa for mood, 1/2 pill.  Has back  pain, takes baclofen and tramadol, take 1/2 -1 pill daily and needs refill.   Current Medications:  Current Outpatient Prescriptions on File Prior to Visit  Medication Sig Dispense Refill  . baclofen (LIORESAL) 10 MG tablet Take 1 tablet (10 mg total) by mouth 2 (two) times daily. 60 tablet 0  . citalopram (CELEXA) 20 MG tablet Start 1/2 pill until next OV in 1 month 30 tablet 2  . traMADol (ULTRAM) 50 MG tablet 1 pill twice daily as needed for pain 60 tablet 0   No current facility-administered medications on file prior to visit.   Medical History:  Past Medical History  Diagnosis Date  . Hypertension    Allergies:  Allergies  Allergen Reactions  . Flexeril [Cyclobenzaprine]      Review of Systems:  Review of Systems  Constitutional: Positive for malaise/fatigue. Negative for fever, chills, weight loss and diaphoresis.  HENT: Negative for congestion, ear discharge, ear pain, hearing loss, nosebleeds, sore throat and tinnitus.   Eyes: Negative.   Respiratory: Negative.  Negative for stridor.   Cardiovascular: Negative.   Gastrointestinal: Positive for heartburn. Negative for nausea, vomiting, abdominal pain, diarrhea, constipation, blood in stool and melena.  Genitourinary: Negative.   Musculoskeletal: Positive for back pain. Negative for myalgias, joint pain, falls and neck pain.  Skin: Negative.   Neurological: Positive for headaches (has TMJ, needs mouth guard). Negative for dizziness, tingling, tremors, sensory change, speech change, focal weakness, seizures, loss of consciousness  and weakness.  Endo/Heme/Allergies: Negative.   Psychiatric/Behavioral: Positive for depression. Negative for suicidal ideas, hallucinations, memory loss and substance abuse. The patient is nervous/anxious and has insomnia.     Family history- Review and unchanged Social history- Review and unchanged Physical Exam: BP 126/90 mmHg  Pulse 83  Temp(Src) 97.7 F (36.5 C) (Temporal)  Resp 16  Ht  5\' 9"  (1.753 m)  Wt 187 lb (84.823 kg)  BMI 27.60 kg/m2  SpO2 97% Wt Readings from Last 3 Encounters:  08/05/15 187 lb (84.823 kg)  05/01/15 191 lb 6.4 oz (86.818 kg)  11/27/14 186 lb (84.369 kg)   General Appearance: Well nourished, in no apparent distress. Eyes: PERRLA, EOMs, conjunctiva no swelling or erythema Sinuses: No Frontal/maxillary tenderness ENT/Mouth: Ext aud canals clear, TMs without erythema, bulging. No erythema, swelling, or exudate on post pharynx.  Tonsils not swollen or erythematous. Hearing normal.  Neck: Supple, thyroid normal.  Respiratory: Respiratory effort normal, BS equal bilaterally without rales, rhonchi, wheezing or stridor.  Cardio: RRR with no MRGs. Brisk peripheral pulses without edema.  Abdomen: Soft, + BS,  Non tender, no guarding, rebound, hernias, masses. Lymphatics: Non tender without lymphadenopathy.  Musculoskeletal: Full ROM, 5/5 strength, Normal gait Skin: Warm, dry without rashes, lesions, ecchymosis.  Neuro: Cranial nerves intact. Normal muscle tone, no cerebellar symptoms. Psych: Awake and oriented X 3, normal affect, Insight and Judgment appropriate.    Quentin MullingAmanda Mael Delap, PA-C 3:03 PM [facility]

## 2015-08-06 LAB — TESTOSTERONE: TESTOSTERONE: 214 ng/dL — AB (ref 300–890)

## 2015-08-12 ENCOUNTER — Other Ambulatory Visit: Payer: Self-pay | Admitting: Physician Assistant

## 2015-08-12 MED ORDER — TESTOSTERONE CYPIONATE 200 MG/ML IM SOLN: INTRAMUSCULAR | Status: DC

## 2015-08-14 DIAGNOSIS — E291 Testicular hypofunction: Secondary | ICD-10-CM | POA: Insufficient documentation

## 2015-08-15 ENCOUNTER — Encounter: Payer: Self-pay | Admitting: Physician Assistant

## 2015-09-19 ENCOUNTER — Ambulatory Visit (INDEPENDENT_AMBULATORY_CARE_PROVIDER_SITE_OTHER): Payer: BLUE CROSS/BLUE SHIELD | Admitting: *Deleted

## 2015-09-19 DIAGNOSIS — E291 Testicular hypofunction: Secondary | ICD-10-CM | POA: Diagnosis not present

## 2015-09-19 NOTE — Progress Notes (Signed)
Patient ID: Dale CrankerJeremy Conley, male   DOB: 1976-01-15, 39 y.o.   MRN: [id number] Patient presents for recheck testosterone lab.  Patient was scheduled for a NV in error. Patient will be given a credit for the $35 co-pay he paid.

## 2015-09-20 LAB — TESTOSTERONE: Testosterone: 799 ng/dL (ref 300–890)

## 2015-11-02 ENCOUNTER — Other Ambulatory Visit: Payer: Self-pay | Admitting: Physician Assistant

## 2015-11-07 ENCOUNTER — Ambulatory Visit: Payer: Self-pay | Admitting: Physician Assistant

## 2015-11-13 ENCOUNTER — Ambulatory Visit (INDEPENDENT_AMBULATORY_CARE_PROVIDER_SITE_OTHER): Payer: No Typology Code available for payment source | Admitting: Physician Assistant

## 2015-11-13 ENCOUNTER — Encounter: Payer: Self-pay | Admitting: Physician Assistant

## 2015-11-13 VITALS — BP 122/86 | HR 93 | Temp 97.7°F | Resp 16 | Ht 69.0 in | Wt 192.0 lb

## 2015-11-13 DIAGNOSIS — M545 Low back pain, unspecified: Secondary | ICD-10-CM

## 2015-11-13 DIAGNOSIS — Z79899 Other long term (current) drug therapy: Secondary | ICD-10-CM

## 2015-11-13 DIAGNOSIS — E291 Testicular hypofunction: Secondary | ICD-10-CM

## 2015-11-13 DIAGNOSIS — R5383 Other fatigue: Secondary | ICD-10-CM | POA: Diagnosis not present

## 2015-11-13 DIAGNOSIS — E559 Vitamin D deficiency, unspecified: Secondary | ICD-10-CM | POA: Diagnosis not present

## 2015-11-13 MED ORDER — PHOSPHATIDYLSERINE-DHA-EPA 75-21.5-8.5 MG PO CAPS: ORAL_CAPSULE | ORAL | Status: DC

## 2015-11-13 MED ORDER — TRAMADOL HCL 50 MG PO TABS: ORAL_TABLET | ORAL | Status: DC

## 2015-11-13 NOTE — Patient Instructions (Signed)

## 2015-11-13 NOTE — Progress Notes (Signed)
Assessment and Plan: 1. Bilateral low back pain without sciatica - traMADol (ULTRAM) 50 MG tablet; 1 pill twice daily as needed for pain  Dispense: 90 tablet; Refill: 0  2. Hypogonadism in male Continue testosterone  3. Other fatigue Continue wellbutrin  4. Vitamin D deficiency  5. Medication management  Future Appointments Date Time Provider Department Center  05/04/2016 2:00 PM Quentin Mulling, PA-C [facility] None     HPI 40 y.o.male presents for follow up for decreased concentration and ADD issues. Has been on celexa but had fatigue with it so he this was stopped and started wellbutrin and Vayarin was added as well as he was found to have a low testosterone, he has had 4 infections, doing 2 cc q 2 weeks, last one was 1 week ago. Lab Results  Component Value Date   TESTOSTERONE 799 09/19/2015     Past Medical History  Diagnosis Date  . Hypertension      Allergies  Allergen Reactions  . Flexeril [Cyclobenzaprine]       Current Outpatient Prescriptions on File Prior to Visit  Medication Sig Dispense Refill  . buPROPion (WELLBUTRIN XL) 150 MG 24 hr tablet TAKE 1 TABLET(150 MG) BY MOUTH EVERY MORNING 90 tablet 0  . Phosphatidylserine-DHA-EPA (VAYARIN) 75-21.5-8.5 MG CAPS 2 pills a day 60 capsule 3  . testosterone cypionate (DEPO-TESTOSTERONE) 200 MG/ML injection 2 cc intramuscularly every 2 weeks OR as directed by your doctor 10 mL 1  . traMADol (ULTRAM) 50 MG tablet 1 pill twice daily as needed for pain 90 tablet 0  . Vitamin D, Ergocalciferol, (DRISDOL) 50000 UNITS CAPS capsule 1 capsule 2 days a week 30 capsule 1  . baclofen (LIORESAL) 10 MG tablet Take 1 tablet (10 mg total) by mouth 2 (two) times daily. (Patient not taking: Reported on 11/13/2015) 60 tablet 0   No current facility-administered medications on file prior to visit.    ROS: all negative except above.   Physical Exam: Filed Weights   11/13/15 0954  Weight: 192 lb (87.091 kg)   BP 122/86 mmHg   Pulse 93  Temp(Src) 97.7 F (36.5 C) (Temporal)  Resp 16  Ht  (1.753 m)  Wt 192 lb (87.091 kg)  BMI 28.34 kg/m2  SpO2 96% General Appearance: Well nourished, in no apparent distress. Eyes: PERRLA, EOMs, conjunctiva no swelling or erythema Sinuses: No Frontal/maxillary tenderness ENT/Mouth: Ext aud canals clear, TMs without erythema, bulging. No erythema, swelling, or exudate on post pharynx.  Tonsils not swollen or erythematous. Hearing normal.  Neck: Supple, thyroid normal.  Respiratory: Respiratory effort normal, BS equal bilaterally without rales, rhonchi, wheezing or stridor.  Cardio: RRR with no MRGs. Brisk peripheral pulses without edema.  Abdomen: Soft, + BS.  Non tender, no guarding, rebound, hernias, masses. Lymphatics: Non tender without lymphadenopathy.  Musculoskeletal: Full ROM, 5/5 strength, normal gait.  Skin: Warm, dry without rashes, lesions, ecchymosis.  Neuro: Cranial nerves intact. Normal muscle tone, no cerebellar symptoms. Sensation intact.  Psych: Awake and oriented X 3, normal affect, Insight and Judgment appropriate.     Quentin Mulling, PA-C 10:03 AM [facility]

## 2016-02-25 ENCOUNTER — Other Ambulatory Visit: Payer: Self-pay | Admitting: Internal Medicine

## 2016-02-25 ENCOUNTER — Other Ambulatory Visit: Payer: Self-pay | Admitting: Physician Assistant

## 2016-02-26 NOTE — Telephone Encounter (Signed)
Rx called in to [facility] pharmacy.

## 2016-05-04 ENCOUNTER — Ambulatory Visit (INDEPENDENT_AMBULATORY_CARE_PROVIDER_SITE_OTHER): Payer: BLUE CROSS/BLUE SHIELD | Admitting: Physician Assistant

## 2016-05-04 ENCOUNTER — Encounter: Payer: Self-pay | Admitting: Physician Assistant

## 2016-05-04 VITALS — BP 128/86 | HR 76 | Temp 97.3°F | Resp 16 | Ht 69.0 in | Wt 189.6 lb

## 2016-05-04 DIAGNOSIS — R6889 Other general symptoms and signs: Secondary | ICD-10-CM | POA: Diagnosis not present

## 2016-05-04 DIAGNOSIS — E291 Testicular hypofunction: Secondary | ICD-10-CM | POA: Diagnosis not present

## 2016-05-04 DIAGNOSIS — D649 Anemia, unspecified: Secondary | ICD-10-CM

## 2016-05-04 DIAGNOSIS — Z136 Encounter for screening for cardiovascular disorders: Secondary | ICD-10-CM | POA: Diagnosis not present

## 2016-05-04 DIAGNOSIS — Z13220 Encounter for screening for lipoid disorders: Secondary | ICD-10-CM

## 2016-05-04 DIAGNOSIS — E559 Vitamin D deficiency, unspecified: Secondary | ICD-10-CM

## 2016-05-04 DIAGNOSIS — Z79899 Other long term (current) drug therapy: Secondary | ICD-10-CM | POA: Insufficient documentation

## 2016-05-04 DIAGNOSIS — I10 Essential (primary) hypertension: Secondary | ICD-10-CM | POA: Diagnosis not present

## 2016-05-04 DIAGNOSIS — Z125 Encounter for screening for malignant neoplasm of prostate: Secondary | ICD-10-CM

## 2016-05-04 DIAGNOSIS — M545 Low back pain, unspecified: Secondary | ICD-10-CM

## 2016-05-04 DIAGNOSIS — Z0001 Encounter for general adult medical examination with abnormal findings: Secondary | ICD-10-CM

## 2016-05-04 DIAGNOSIS — Z0000 Encounter for general adult medical examination without abnormal findings: Secondary | ICD-10-CM

## 2016-05-04 LAB — IRON AND TIBC
%SAT: 52 % (ref 15–60)
IRON: 192 ug/dL — AB (ref 50–180)
TIBC: 368 ug/dL (ref 250–425)
UIBC: 176 ug/dL (ref 125–400)

## 2016-05-04 LAB — HEPATIC FUNCTION PANEL
ALT: 15 U/L (ref 9–46)
AST: 16 U/L (ref 10–40)
Albumin: 4.3 g/dL (ref 3.6–5.1)
Alkaline Phosphatase: 55 U/L (ref 40–115)
BILIRUBIN DIRECT: 0.1 mg/dL (ref <=0.2)
BILIRUBIN INDIRECT: 0.3 mg/dL (ref 0.2–1.2)
Total Bilirubin: 0.4 mg/dL (ref 0.2–1.2)
Total Protein: 6.8 g/dL (ref 6.1–8.1)

## 2016-05-04 LAB — CBC WITH DIFFERENTIAL/PLATELET
BASOS PCT: 0 %
Basophils Absolute: 0 cells/uL (ref 0–200)
EOS PCT: 2 %
Eosinophils Absolute: 140 cells/uL (ref 15–500)
HEMATOCRIT: 41.4 % (ref 38.5–50.0)
Hemoglobin: 14 g/dL (ref 13.2–17.1)
LYMPHS PCT: 40 %
Lymphs Abs: 2800 cells/uL (ref 850–3900)
MCH: 31.5 pg (ref 27.0–33.0)
MCHC: 33.8 g/dL (ref 32.0–36.0)
MCV: 93.2 fL (ref 80.0–100.0)
MONO ABS: 700 {cells}/uL (ref 200–950)
MONOS PCT: 10 %
MPV: 8.9 fL (ref 7.5–12.5)
NEUTROS PCT: 48 %
Neutro Abs: 3360 cells/uL (ref 1500–7800)
PLATELETS: 211 10*3/uL (ref 140–400)
RBC: 4.44 MIL/uL (ref 4.20–5.80)
RDW: 12.9 % (ref 11.0–15.0)
WBC: 7 10*3/uL (ref 3.8–10.8)

## 2016-05-04 LAB — BASIC METABOLIC PANEL WITH GFR
BUN: 16 mg/dL (ref 7–25)
CALCIUM: 9.3 mg/dL (ref 8.6–10.3)
CHLORIDE: 104 mmol/L (ref 98–110)
CO2: 24 mmol/L (ref 20–31)
Creat: 0.88 mg/dL (ref 0.60–1.35)
GFR, Est African American: >89 mL/min (ref >=60)
Glucose, Bld: 92 mg/dL (ref 65–99)
POTASSIUM: 4 mmol/L (ref 3.5–5.3)
SODIUM: 138 mmol/L (ref 135–146)

## 2016-05-04 LAB — MAGNESIUM: Magnesium: 1.9 mg/dL (ref 1.5–2.5)

## 2016-05-04 LAB — LIPID PANEL
CHOL/HDL RATIO: 2.8 ratio (ref <=5.0)
CHOLESTEROL: 218 mg/dL — AB (ref 125–200)
HDL: 79 mg/dL (ref >=40)
LDL Cholesterol: 111 mg/dL (ref <130)
Triglycerides: 140 mg/dL (ref <150)
VLDL: 28 mg/dL (ref <30)

## 2016-05-04 LAB — TSH: TSH: 1.42 mIU/L (ref 0.40–4.50)

## 2016-05-04 LAB — PSA: PSA: 0.4 ng/mL (ref <=4.0)

## 2016-05-04 LAB — FERRITIN: Ferritin: 199 ng/mL (ref 20–345)

## 2016-05-04 MED ORDER — TESTOSTERONE 20.25 MG/ACT (1.62%) TD GEL: TRANSDERMAL | 4 refills | Status: DC

## 2016-05-04 NOTE — Patient Instructions (Signed)
I think it is possible that you have sleep apnea. It can cause interrupted sleep, headaches, frequent awakenings, fatigue, dry mouth, fast/slow heart beats, memory issues, anxiety/depression, swelling, numbness tingling hands/feet, weight gain, shortness of breath, and the list goes on. Sleep apnea needs to be ruled out because if it is left untreated it does eventually lead to abnormal heart beats, lung failure or heart failure as well as increasing the risk of heart attack and stroke. There are masks you can wear OR a mouth piece that I can give you information about. Often times though people feel MUCH better after getting treatment.   Sleep Apnea  Sleep apnea is a sleep disorder characterized by abnormal pauses in breathing while you sleep. When your breathing pauses, the level of oxygen in your blood decreases. This causes you to move out of deep sleep and into light sleep. As a result, your quality of sleep is poor, and the system that carries your blood throughout your body (cardiovascular system) experiences stress. If sleep apnea remains untreated, the following conditions can develop:  High blood pressure (hypertension).  Coronary artery disease.  Inability to achieve or maintain an erection (impotence).  Impairment of your thought process (cognitive dysfunction). There are three types of sleep apnea: 1. Obstructive sleep apnea--Pauses in breathing during sleep because of a blocked airway. 2. Central sleep apnea--Pauses in breathing during sleep because the area of the brain that controls your breathing does not send the correct signals to the muscles that control breathing. 3. Mixed sleep apnea--A combination of both obstructive and central sleep apnea.  RISK FACTORS The following risk factors can increase your risk of developing sleep apnea:  Being overweight.  Smoking.  Having narrow passages in your nose and throat.  Being of older age.  Being male.  Alcohol use.   Sedative and tranquilizer use.  Ethnicity. Among individuals younger than 35 years, African Americans are at increased risk of sleep apnea. SYMPTOMS   Difficulty staying asleep.  Daytime sleepiness and fatigue.  Loss of energy.  Irritability.  Loud, heavy snoring.  Morning headaches.  Trouble concentrating.  Forgetfulness.  Decreased interest in sex. DIAGNOSIS  In order to diagnose sleep apnea, your caregiver will perform a physical examination. Your caregiver may suggest that you take a home sleep test. Your caregiver may also recommend that you spend the night in a sleep lab. In the sleep lab, several monitors record information about your heart, lungs, and brain while you sleep. Your leg and arm movements and blood oxygen level are also recorded. TREATMENT The following actions may help to resolve mild sleep apnea:  Sleeping on your side.   Using a decongestant if you have nasal congestion.   Avoiding the use of depressants, including alcohol, sedatives, and narcotics.   Losing weight and modifying your diet if you are overweight. There also are devices and treatments to help open your airway:  Oral appliances. These are custom-made mouthpieces that shift your lower jaw forward and slightly open your bite. This opens your airway.  Devices that create positive airway pressure. This positive pressure "splints" your airway open to help you breathe better during sleep. The following devices create positive airway pressure:  Continuous positive airway pressure (CPAP) device. The CPAP device creates a continuous level of air pressure with an air pump. The air is delivered to your airway through a mask while you sleep. This continuous pressure keeps your airway open.  Nasal expiratory positive airway pressure (EPAP) device. The EPAP device   creates positive air pressure as you exhale. The device consists of single-use valves, which are inserted into each nostril and held in  place by adhesive. The valves create very little resistance when you inhale but create much more resistance when you exhale. That increased resistance creates the positive airway pressure. This positive pressure while you exhale keeps your airway open, making it easier to breath when you inhale again.  Bilevel positive airway pressure (BPAP) device. The BPAP device is used mainly in patients with central sleep apnea. This device is similar to the CPAP device because it also uses an air pump to deliver continuous air pressure through a mask. However, with the BPAP machine, the pressure is set at two different levels. The pressure when you exhale is lower than the pressure when you inhale.  Surgery. Typically, surgery is only done if you cannot comply with less invasive treatments or if the less invasive treatments do not improve your condition. Surgery involves removing excess tissue in your airway to create a wider passage way. Document Released: 09/04/2002 Document Revised: 01/09/2013 Document Reviewed: 01/21/2012 ExitCare Patient Information 2015 [employer]. This information is not intended to replace advice given to you by your health care provider. Make sure you discuss any questions you have with your health care provider.     

## 2016-05-04 NOTE — Progress Notes (Signed)
Complete Physical  Assessment and Plan:  Hypogonadism in male -     Testosterone (ANDROGEL PUMP) 20.25 MG/ACT (1.62%) GEL; 2 pumps each arm daily, (4 pumps total) -     Testosterone  Bilateral low back pain without sciatica - no red flags, declines PT and is very insistent that he sees ortho, will refer -     Ambulatory referral to Orthopedic Surgery  Vitamin D deficiency -     VITAMIN D 25 Hydroxy (Vit-D Deficiency, Fractures)  Essential hypertension -     CBC with Differential/Platelet -     BASIC METABOLIC PANEL WITH GFR -     Hepatic function panel -     TSH -     Urinalysis, Routine w reflex microscopic (not at [facility]) -     Microalbumin / creatinine urine ratio -     EKG 12-Lead  Routine general medical examination at a health care facility -     CBC with Differential/Platelet -     BASIC METABOLIC PANEL WITH GFR -     Hepatic function panel -     TSH -     Lipid panel -     Magnesium -     VITAMIN D 25 Hydroxy (Vit-D Deficiency, Fractures) -     Urinalysis, Routine w reflex microscopic (not at [facility]) -     Microalbumin / creatinine urine ratio -     Iron and TIBC -     Ferritin -     Testosterone -     PSA -     EKG 12-Lead  Medication management -     Magnesium -     PSA  Screening cholesterol level -     Lipid panel  Anemia, unspecified anemia type -     Iron and TIBC -     Ferritin  Screening for prostate cancer -     PSA  Discussed med's effects and SE's. Screening labs and tests as requested with regular follow-up as recommended. Over 40 minutes of exam, counseling, chart review and critical decision making was performed   HPI  This very nice 40 y.o.male presents for complete physical.  Patient has no major health issues.   He does not workout anymore due to heat and new baby. He has a history of elevated BP, worse when he comes to the doctors, has checked at home and it is okay, BP today is BP: 128/86  Finally, patient has history of Vitamin D  Deficiency and last vitamin D was  Currently on supplementation.  Married with new baby girl, she is 1  Year and 1 months old, [place], [place]. Works for [employer], travels a lot, is in the car a lot. 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  He has a history of testosterone deficiency and is on testosterone replacement. He states that the testosterone helps with his energy, libido, muscle mass. Lab Results  Component Value Date   TESTOSTERONE 799 09/19/2015   Occ lower back pain, has seen chiropractor and has done at home stretches, will having back issues, will have occ bilateral tingling lateral legs, very rare, will only go down to mid hip. Ibuprofen, tylenol can help. Would like referral to ortho, declines PT referral.  BMI is Body mass index is 28 kg/m., he is working on diet and exercise. Wt Readings from Last 3 Encounters:  05/04/16 189 lb 9.6 oz (86 kg)  11/13/15 192 lb (87.1 kg)  08/05/15 187 lb (84.8 kg)    Current Medications:  Current Outpatient Prescriptions on File Prior to Visit  Medication Sig Dispense Refill  . buPROPion (WELLBUTRIN XL) 150 MG 24 hr tablet TAKE 1 TABLET(150 MG) BY MOUTH EVERY MORNING 90 tablet 0  . traMADol (ULTRAM) 50 MG tablet TAKE 1 TABLET TWICE DAILY AS NEEDED FOR PAIN 90 tablet 0  . baclofen (LIORESAL) 10 MG tablet Take 1 tablet (10 mg total) by mouth 2 (two) times daily. (Patient not taking: Reported on 05/04/2016) 60 tablet 0  . Phosphatidylserine-DHA-EPA (VAYARIN) 75-21.5-8.5 MG CAPS 2 pills a day (Patient not taking: Reported on 05/04/2016) 60 capsule 3  . Vitamin D, Ergocalciferol, (DRISDOL) 50000 UNITS CAPS capsule 1 capsule 2 days a week (Patient not taking: Reported on 05/04/2016) 30 capsule 1   No current facility-administered medications on file prior to visit.    Health Maintenance:   TD/TDAP: 2015 Influenza: 2015 Pneumovax: N/A Prevnar 13: N/A  Sexually Active: yes, wife  Last Dental Exam: Dr. Ninetta LightsHatcher, q 6  months Last Eye Exam: None  Medical History:  Past Medical History:  Diagnosis Date  . Hypertension    Allergies Allergies  Allergen Reactions  . Flexeril [Cyclobenzaprine]     SURGICAL HISTORY He  has no past surgical history on file. FAMILY HISTORY His family history includes Diabetes in his paternal grandfather; Heart disease (age of onset: 5550) in his father; Hyperlipidemia in his father; Hypertension in his father. SOCIAL HISTORY He  reports that he has never smoked. He has never used smokeless tobacco. He reports that he drinks about 8.4 oz of alcohol per week . He reports that he does not use drugs.  Review of Systems: Review of Systems  Constitutional: Positive for malaise/fatigue. Negative for chills, diaphoresis, fever and weight loss.  HENT: Negative for congestion, ear discharge, ear pain, hearing loss, nosebleeds, sore throat and tinnitus.   Eyes: Negative.   Respiratory: Negative.  Negative for stridor.   Cardiovascular: Negative.   Gastrointestinal: Positive for heartburn. Negative for abdominal pain, blood in stool, constipation, diarrhea, melena, nausea and vomiting.  Genitourinary: Negative.   Musculoskeletal: Positive for back pain. Negative for falls, joint pain, myalgias and neck pain.  Skin: Negative.   Neurological: Negative for dizziness, tingling, tremors, sensory change, speech change, focal weakness, seizures, loss of consciousness, weakness and headaches (has TMJ, needs mouth guard).  Endo/Heme/Allergies: Negative.   Psychiatric/Behavioral: Negative for depression, hallucinations, memory loss, substance abuse and suicidal ideas. The patient has insomnia. The patient is not nervous/anxious.    Physical Exam: Estimated body mass index is 28 kg/m as calculated from the following:   Height as of this encounter: 5\' 9"  (1.753 m).   Weight as of this encounter: 189 lb 9.6 oz (86 kg). BP 128/86   Pulse 76   Temp 97.3 F (36.3 C)   Resp 16   Ht 5\' 9"   (1.753 m)   Wt 189 lb 9.6 oz (86 kg)   BMI 28.00 kg/m  General Appearance: Well nourished, in no apparent distress.  Eyes: PERRLA, EOMs, conjunctiva no swelling or erythema, normal fundi and vessels.  Sinuses: No Frontal/maxillary tenderness  ENT/Mouth: Ext aud canals clear, normal light reflex with TMs without erythema, bulging. Good dentition. No erythema, swelling, or exudate on post pharynx. Tonsils not swollen or erythematous. Hearing normal.  Neck: Supple, thyroid normal. No bruits  Respiratory: Respiratory effort normal, BS equal bilaterally without rales, rhonchi, wheezing or stridor.  Cardio: RRR without murmurs, rubs or gallops.  Brisk peripheral pulses without edema.  Chest: symmetric, with normal excursions and percussion.  Abdomen: Soft, nontender, no guarding, rebound, hernias, masses, or organomegaly.  Lymphatics: Non tender without lymphadenopathy.  Genitourinary: defer Musculoskeletal: Full ROM all peripheral extremities,5/5 strength, and normal gait. Negative straight leg.   Skin: Warm, dry without rashes, lesions, ecchymosis. Neuro: Cranial nerves intact, reflexes equal bilaterally. Normal muscle tone, no cerebellar symptoms. Sensation intact.  Psych: Awake and oriented X 3, normal affect, Insight and Judgment appropriate.   EKG: WNL, IRBBB  Quentin Mulling 2:23 PM [facility]

## 2016-05-05 LAB — URINALYSIS, ROUTINE W REFLEX MICROSCOPIC
BILIRUBIN URINE: NEGATIVE
Glucose, UA: NEGATIVE
Hgb urine dipstick: NEGATIVE
Ketones, ur: NEGATIVE
LEUKOCYTES UA: NEGATIVE
Nitrite: NEGATIVE
PROTEIN: NEGATIVE
SPECIFIC GRAVITY, URINE: 1.022 (ref 1.001–1.035)
pH: 6 (ref 5.0–8.0)

## 2016-05-05 LAB — VITAMIN D 25 HYDROXY (VIT D DEFICIENCY, FRACTURES): VIT D 25 HYDROXY: 29 ng/mL — AB (ref 30–100)

## 2016-05-05 LAB — MICROALBUMIN / CREATININE URINE RATIO
CREATININE, URINE: 133 mg/dL (ref 20–370)
MICROALB UR: 0.7 mg/dL
MICROALB/CREAT RATIO: 5 ug/mg{creat} (ref <30)

## 2016-05-05 LAB — TESTOSTERONE: Testosterone: 214 ng/dL — ABNORMAL LOW (ref 250–827)

## 2016-05-22 DIAGNOSIS — M5416 Radiculopathy, lumbar region: Secondary | ICD-10-CM | POA: Diagnosis not present

## 2016-05-30 DIAGNOSIS — M545 Low back pain: Secondary | ICD-10-CM | POA: Diagnosis not present

## 2016-06-05 DIAGNOSIS — M545 Low back pain: Secondary | ICD-10-CM | POA: Diagnosis not present

## 2016-06-08 DIAGNOSIS — M47816 Spondylosis without myelopathy or radiculopathy, lumbar region: Secondary | ICD-10-CM | POA: Diagnosis not present

## 2016-06-11 DIAGNOSIS — M545 Low back pain: Secondary | ICD-10-CM | POA: Diagnosis not present

## 2016-06-24 DIAGNOSIS — M545 Low back pain: Secondary | ICD-10-CM | POA: Diagnosis not present

## 2016-06-25 DIAGNOSIS — M47816 Spondylosis without myelopathy or radiculopathy, lumbar region: Secondary | ICD-10-CM | POA: Diagnosis not present

## 2016-07-01 DIAGNOSIS — M545 Low back pain: Secondary | ICD-10-CM | POA: Diagnosis not present

## 2016-07-03 ENCOUNTER — Other Ambulatory Visit: Payer: Self-pay | Admitting: Physician Assistant

## 2016-07-06 NOTE — Telephone Encounter (Signed)
CALLED INTO [facility]

## 2016-07-09 DIAGNOSIS — M545 Low back pain: Secondary | ICD-10-CM | POA: Diagnosis not present

## 2016-07-13 DIAGNOSIS — M47816 Spondylosis without myelopathy or radiculopathy, lumbar region: Secondary | ICD-10-CM | POA: Diagnosis not present

## 2016-07-22 DIAGNOSIS — M545 Low back pain: Secondary | ICD-10-CM | POA: Diagnosis not present

## 2016-07-31 DIAGNOSIS — M545 Low back pain: Secondary | ICD-10-CM | POA: Diagnosis not present

## 2016-08-04 DIAGNOSIS — M47816 Spondylosis without myelopathy or radiculopathy, lumbar region: Secondary | ICD-10-CM | POA: Diagnosis not present

## 2016-08-25 DIAGNOSIS — M47816 Spondylosis without myelopathy or radiculopathy, lumbar region: Secondary | ICD-10-CM | POA: Diagnosis not present

## 2016-08-26 ENCOUNTER — Encounter: Payer: Self-pay | Admitting: Physician Assistant

## 2016-08-26 ENCOUNTER — Ambulatory Visit (INDEPENDENT_AMBULATORY_CARE_PROVIDER_SITE_OTHER): Payer: BLUE CROSS/BLUE SHIELD | Admitting: Physician Assistant

## 2016-08-26 VITALS — BP 140/100 | HR 76 | Resp 14 | Ht 69.0 in | Wt 189.2 lb

## 2016-08-26 DIAGNOSIS — J0100 Acute maxillary sinusitis, unspecified: Secondary | ICD-10-CM | POA: Diagnosis not present

## 2016-08-26 MED ORDER — AZITHROMYCIN 250 MG PO TABS: ORAL_TABLET | ORAL | 1 refills | Status: AC

## 2016-08-26 MED ORDER — PREDNISONE 20 MG PO TABS: ORAL_TABLET | ORAL | 0 refills | Status: DC

## 2016-08-26 MED ORDER — PROMETHAZINE-DM 6.25-15 MG/5ML PO SYRP: 5.0000 mL | ORAL_SOLUTION | Freq: Four times a day (QID) | ORAL | 1 refills | Status: DC | PRN

## 2016-08-26 NOTE — Progress Notes (Signed)
   Subjective:    Patient ID: Dale Conley, male    DOB: [birth year], 40 y.o.   MRN: [id number]  HPI 40 y.o. WM presents for sore throat x 3 days. No fever or chills. Has been taking dayquil/nyquil with no help. No sick contacts.  Symptoms include congestion, nasal congestion, no  fever, post nasal drip and sore throat.    Blood pressure (!) 140/100, pulse 76, resp. rate 14, height 5\' 9"  (1.753 m), weight 189 lb 3.2 oz (85.8 kg), SpO2 97 %.  Medications Current Outpatient Prescriptions on File Prior to Visit  Medication Sig  . buPROPion (WELLBUTRIN XL) 150 MG 24 hr tablet TAKE 1 TABLET(150 MG) BY MOUTH EVERY MORNING  . Phosphatidylserine-DHA-EPA (VAYARIN) 75-21.5-8.5 MG CAPS 2 pills a day  . Testosterone (ANDROGEL PUMP) 20.25 MG/ACT (1.62%) GEL 2 pumps each arm daily, (4 pumps total)  . traMADol (ULTRAM) 50 MG tablet TAKE 1 TABLET BY MOUTH TWICE DAILY AS NEEDED FOR PAIN  . Vitamin D, Ergocalciferol, (DRISDOL) 50000 UNITS CAPS capsule 1 capsule 2 days a week   No current facility-administered medications on file prior to visit.     Problem list He has Back pain; Hypogonadism in male; Essential hypertension; Vitamin D deficiency; and Medication management on his problem list.  Review of Systems  Constitutional: Negative for chills, diaphoresis and fever.  HENT: Positive for congestion, sinus pressure, sneezing and sore throat. Negative for ear pain, trouble swallowing and voice change.   Eyes: Negative.   Respiratory: Positive for cough. Negative for chest tightness, shortness of breath and wheezing.   Cardiovascular: Negative.   Gastrointestinal: Negative.   Genitourinary: Negative.   Musculoskeletal: Negative for neck pain.  Neurological: Negative.  Negative for headaches.       Objective:   Physical Exam  Constitutional: He appears well-developed and well-nourished.  HENT:  Head: Normocephalic and atraumatic.  Right Ear: External ear normal.  Left Ear: External ear normal.   Nose: Right sinus exhibits maxillary sinus tenderness. Left sinus exhibits maxillary sinus tenderness.  Mouth/Throat: Posterior oropharyngeal erythema present.  Eyes: Conjunctivae are normal. Pupils are equal, round, and reactive to light.  Neck: Normal range of motion. Neck supple.  Cardiovascular: Normal rate, regular rhythm and normal heart sounds.   Pulmonary/Chest: Effort normal and breath sounds normal. He has no wheezes.  Abdominal: Soft. Bowel sounds are normal.  Lymphadenopathy:    He has no cervical adenopathy.  Skin: Skin is warm and dry.      Assessment & Plan:  1. Acute non-recurrent maxillary sinusitis Will hold the zpak and take if she is not getting better, increase fluids, rest, cont allergy pill - predniSONE (DELTASONE) 20 MG tablet; 2 tablets daily for 3 days, 1 tablet daily for 4 days.  Dispense: 10 tablet; Refill: 0 - azithromycin (ZITHROMAX) 250 MG tablet; Take 2 tablets (500 mg) on  Day 1,  followed by 1 tablet (250 mg) once daily on Days 2 through 5.  Dispense: 6 each; Refill: 1 - promethazine-dextromethorphan (PROMETHAZINE-DM) 6.25-15 MG/5ML syrup; Take 5 mLs by mouth 4 (four) times daily as needed for cough.  Dispense: 240 mL; Refill: 1

## 2016-08-26 NOTE — Patient Instructions (Signed)
Please take the prednisone to help decrease inflammation and therefore decrease symptoms. Take it it with food to avoid GI upset. It can cause increased energy but on the other hand it can make it hard to sleep at night so please take it AT NIGHT WITH DINNER, it takes 8-12 hours to start working so it will NOT affect your sleeping if you take it at night with your food!!  If you are diabetic it will increase your sugars so decrease carbs and monitor your sugars closely.    HOW TO TREAT VIRAL COUGH AND COLD SYMPTOMS:  -Symptoms usually last at least 1 week with the worst symptoms being around day 4.  - colds usually start with a sore throat and end with a cough, and the cough can take 2 weeks to get better.  -No antibiotics are needed for colds, flu, sore throats, cough, bronchitis UNLESS symptoms are longer than 7 days OR if you are getting better then get drastically worse.  -There are a lot of combination medications (Dayquil, Nyquil, Vicks 44, tyelnol cold and sinus, ETC). Please look at the ingredients on the back so that you are treating the correct symptoms and not doubling up on medications/ingredients.    Medicines you can use  Nasal congestion  - pseudoephedrine (Sudafed)- behind the counter, do not use if you have high blood pressure, medicine that have -D in them.  - phenylephrine (Sudafed PE) -Dextormethorphan + chlorpheniramine (Coridcidin HBP)- okay if you have high blood pressure -Oxymetazoline (Afrin) nasal spray- LIMIT to 3 days -Saline nasal spray -Neti pot (used distilled or bottled water)  Ear pain/congestion  -pseudoephedrine (sudafed) - Nasonex/flonase nasal spray  Fever  -Acetaminophen (Tyelnol) -Ibuprofen (Advil, motrin, aleve)  Sore Throat  -Acetaminophen (Tyelnol) -Ibuprofen (Advil, motrin, aleve) -Drink a lot of water -Gargle with salt water - Rest your voice (don't talk) -Throat sprays -Cough drops  Body Aches  -Acetaminophen (Tyelnol) -Ibuprofen  (Advil, motrin, aleve)  Headache  -Acetaminophen (Tyelnol) -Ibuprofen (Advil, motrin, aleve) - Exedrin, Exedrin Migraine  Allergy symptoms (cough, sneeze, runny nose, itchy eyes) -Claritin or loratadine cheapest but likely the weakest  -Zyrtec or certizine at night because it can make you sleepy -The strongest is allegra or fexafinadine  Cheapest at [employer], [employer], [employer]  Cough  -Dextromethorphan (Delsym)- medicine that has DM in it -Guafenesin (Mucinex/Robitussin) - cough drops - drink lots of water  Chest Congestion  -Guafenesin (Mucinex/Robitussin)  Red Itchy Eyes  - Naphcon-A  Upset Stomach  - Bland diet (nothing spicy, greasy, fried, and high acid foods like tomatoes, oranges, berries) -OKAY- cereal, bread, soup, crackers, rice -Eat smaller more frequent meals -reduce caffeine, no alcohol -Loperamide (Imodium-AD) if diarrhea -Prevacid for heart burn  General health when sick  -Hydration -wash your hands frequently -keep surfaces clean -change pillow cases and sheets often -Get fresh air but do not exercise strenuously -Vitamin D, double up on it - Vitamin C -Zinc       

## 2016-09-09 DIAGNOSIS — M47816 Spondylosis without myelopathy or radiculopathy, lumbar region: Secondary | ICD-10-CM | POA: Diagnosis not present

## 2016-09-10 ENCOUNTER — Other Ambulatory Visit: Payer: Self-pay | Admitting: Internal Medicine

## 2016-09-10 ENCOUNTER — Other Ambulatory Visit: Payer: Self-pay | Admitting: Physician Assistant

## 2016-10-27 DIAGNOSIS — M47816 Spondylosis without myelopathy or radiculopathy, lumbar region: Secondary | ICD-10-CM | POA: Diagnosis not present

## 2016-11-30 ENCOUNTER — Other Ambulatory Visit: Payer: Self-pay | Admitting: Physician Assistant

## 2016-12-01 DIAGNOSIS — M47816 Spondylosis without myelopathy or radiculopathy, lumbar region: Secondary | ICD-10-CM | POA: Diagnosis not present

## 2016-12-17 ENCOUNTER — Ambulatory Visit (INDEPENDENT_AMBULATORY_CARE_PROVIDER_SITE_OTHER): Payer: BLUE CROSS/BLUE SHIELD | Admitting: Physician Assistant

## 2016-12-17 ENCOUNTER — Ambulatory Visit (HOSPITAL_COMMUNITY)
Admission: RE | Admit: 2016-12-17 | Discharge: 2016-12-17 | Disposition: A | Discharge status: Home / Self Care | Payer: BLUE CROSS/BLUE SHIELD | Source: Ambulatory Visit | Attending: Physician Assistant | Admitting: Physician Assistant

## 2016-12-17 ENCOUNTER — Encounter: Payer: Self-pay | Admitting: Physician Assistant

## 2016-12-17 VITALS — BP 130/90 | HR 79 | Temp 97.3°F | Resp 16 | Ht 69.0 in | Wt 196.6 lb

## 2016-12-17 DIAGNOSIS — R079 Chest pain, unspecified: Secondary | ICD-10-CM | POA: Insufficient documentation

## 2016-12-17 DIAGNOSIS — J029 Acute pharyngitis, unspecified: Secondary | ICD-10-CM | POA: Insufficient documentation

## 2016-12-17 DIAGNOSIS — J40 Bronchitis, not specified as acute or chronic: Secondary | ICD-10-CM | POA: Diagnosis not present

## 2016-12-17 DIAGNOSIS — R0602 Shortness of breath: Secondary | ICD-10-CM | POA: Diagnosis not present

## 2016-12-17 DIAGNOSIS — R05 Cough: Secondary | ICD-10-CM | POA: Insufficient documentation

## 2016-12-17 DIAGNOSIS — R918 Other nonspecific abnormal finding of lung field: Secondary | ICD-10-CM | POA: Diagnosis not present

## 2016-12-17 MED ORDER — AZITHROMYCIN 250 MG PO TABS: ORAL_TABLET | ORAL | 1 refills | Status: AC

## 2016-12-17 MED ORDER — PROMETHAZINE-DM 6.25-15 MG/5ML PO SYRP: 5.0000 mL | ORAL_SOLUTION | Freq: Four times a day (QID) | ORAL | 1 refills | Status: DC | PRN

## 2016-12-17 MED ORDER — PREDNISONE 20 MG PO TABS: ORAL_TABLET | ORAL | 0 refills | Status: DC

## 2016-12-17 MED ORDER — ALBUTEROL SULFATE HFA 108 (90 BASE) MCG/ACT IN AERS: 2.0000 | INHALATION_SPRAY | Freq: Four times a day (QID) | RESPIRATORY_TRACT | 2 refills | Status: DC | PRN

## 2016-12-17 MED ORDER — IPRATROPIUM-ALBUTEROL 0.5-2.5 (3) MG/3ML IN SOLN
3.0000 mL | Freq: Once | RESPIRATORY_TRACT | Status: AC
  Administered 2016-12-17: 3 mL via RESPIRATORY_TRACT

## 2016-12-17 NOTE — Progress Notes (Signed)
   Subjective:    Patient ID: Dale Conley, male    DOB: 1976/08/24, 41 y.o.   MRN: [id number]  HPI 41 y.o. WM presents with sore throat, cough x 1 week . Has not tried anything over the counter, had some chills last week but no subjective fever. Some SOB, no CP, cough without mucus.   Blood pressure 130/90, pulse 79, temperature 97.3 F (36.3 C), resp. rate 16, height 5\' 9"  (1.753 m), weight 196 lb 9.6 oz (89.2 kg), SpO2 95 %.  Medications Current Outpatient Prescriptions on File Prior to Visit  Medication Sig  . buPROPion (WELLBUTRIN XL) 150 MG 24 hr tablet TAKE 1 TABLET(150 MG) BY MOUTH EVERY MORNING  . Phosphatidylserine-DHA-EPA (VAYARIN) 75-21.5-8.5 MG CAPS 2 pills a day  . Testosterone (ANDROGEL PUMP) 20.25 MG/ACT (1.62%) GEL 2 pumps each arm daily, (4 pumps total)  . traMADol (ULTRAM) 50 MG tablet TAKE 1 TABLET BY MOUTH TWICE DAILY AS NEEDED FOR PAIN  . Vitamin D, Ergocalciferol, (DRISDOL) 50000 units CAPS capsule TAKE 1 CAPSULE 2 DAYS A WEEK   No current facility-administered medications on file prior to visit.     Problem list He has Back pain; Hypogonadism in male; Essential hypertension; Vitamin D deficiency; and Medication management on his problem list.   Review of Systems  Constitutional: Negative for chills, diaphoresis and fever.  HENT: Positive for congestion, postnasal drip, rhinorrhea, sinus pressure and sore throat. Negative for ear pain, sneezing, trouble swallowing and voice change.   Eyes: Negative.   Respiratory: Positive for cough, shortness of breath and wheezing. Negative for chest tightness.   Cardiovascular: Negative.   Gastrointestinal: Negative.   Genitourinary: Negative.   Musculoskeletal: Negative.  Negative for neck pain.  Neurological: Negative.  Negative for headaches.       Objective:   Physical Exam  Constitutional: He is oriented to person, place, and time. He appears well-developed and well-nourished.  HENT:  Head: Normocephalic and  atraumatic.  Right Ear: External ear normal.  Left Ear: External ear normal.  Nose: Nose normal.  Mouth/Throat: Oropharynx is clear and moist.  Eyes: Conjunctivae are normal. Pupils are equal, round, and reactive to light.  Neck: Normal range of motion. Neck supple.  Cardiovascular: Normal rate, regular rhythm and normal heart sounds.   No murmur heard. Pulmonary/Chest: Effort normal. No respiratory distress. He has wheezes (bilateral lower lobes). He has no rales. He exhibits no tenderness.  Abdominal: Soft. Bowel sounds are normal.  Lymphadenopathy:    He has no cervical adenopathy.  Neurological: He is alert and oriented to person, place, and time.  Skin: Skin is warm and dry.       Assessment & Plan:  1. Bronchitis Breathing treatment in the office, feels better, get CXR - predniSONE (DELTASONE) 20 MG tablet; 2 tablets daily for 3 days, 1 tablet daily for 4 days.  Dispense: 10 tablet; Refill: 0 - promethazine-dextromethorphan (PROMETHAZINE-DM) 6.25-15 MG/5ML syrup; Take 5 mLs by mouth 4 (four) times daily as needed for cough.  Dispense: 240 mL; Refill: 1 - azithromycin (ZITHROMAX) 250 MG tablet; Take 2 tablets (500 mg) on  Day 1,  followed by 1 tablet (250 mg) once daily on Days 2 through 5.  Dispense: 6 each; Refill: 1 - ipratropium-albuterol (DUONEB) 0.5-2.5 (3) MG/3ML nebulizer solution 3 mL; Take 3 mLs by nebulization once.

## 2016-12-17 NOTE — Patient Instructions (Signed)

## 2016-12-21 DIAGNOSIS — M47816 Spondylosis without myelopathy or radiculopathy, lumbar region: Secondary | ICD-10-CM | POA: Diagnosis not present

## 2016-12-26 ENCOUNTER — Other Ambulatory Visit: Payer: Self-pay | Admitting: Physician Assistant

## 2016-12-26 DIAGNOSIS — J40 Bronchitis, not specified as acute or chronic: Secondary | ICD-10-CM

## 2017-02-19 ENCOUNTER — Other Ambulatory Visit: Payer: Self-pay | Admitting: Physician Assistant

## 2017-02-23 NOTE — Telephone Encounter (Signed)
TRAMADOL WAS CALLED INTO PHARMACY AT 10:07AM ON 29TH MAY 2018 BY DD

## 2017-04-22 ENCOUNTER — Other Ambulatory Visit: Payer: Self-pay | Admitting: Internal Medicine

## 2017-04-22 ENCOUNTER — Other Ambulatory Visit: Payer: Self-pay | Admitting: Physician Assistant

## 2017-04-23 NOTE — Telephone Encounter (Signed)
Tramadol was called into pharmacy on July 27th 2018 at 8:18am by DD

## 2017-05-17 NOTE — Progress Notes (Signed)
Complete Physical  Assessment and Plan:  Hypogonadism in male -     Testosterone (ANDROGEL PUMP) 20.25 MG/ACT (1.62%) GEL; 2 pumps each arm daily, (4 pumps total) -     Testosterone  Bilateral low back pain without sciatica - no red flags, declines PT and is very insistent that he sees ortho, will refer -     Ambulatory referral to Orthopedic Surgery  Vitamin D deficiency -     VITAMIN D 25 Hydroxy (Vit-D Deficiency, Fractures)  Essential hypertension -     CBC with Differential/Platelet -     BASIC METABOLIC PANEL WITH GFR -     Hepatic function panel -     TSH -     Urinalysis, Routine w reflex microscopic (not at [facility]) -     Microalbumin / creatinine urine ratio -     EKG 12-Lead  Routine general medical examination at a health care facility  Medication management -     Magnesium -     PSA  Screening cholesterol level -     Lipid panel  Anemia, unspecified anemia type -     Iron and TIBC -     Ferritin  Screening for prostate cancer -     PSA  Discussed med's effects and SE's. Screening labs and tests as requested with regular follow-up as recommended. Over 40 minutes of exam, counseling, chart review and critical decision making was performed   HPI  This very nice 40 y.o.male presents for complete physical.  Patient has no major health issues.   He does not workout anymore due to heat and new baby. He has a history of elevated BP, worse when he comes to the doctors, has checked at home and it is okay, BP today is    Married with new baby girl, she is 1  Year and 32 months old, [place], [place]. Works for [employer], travels a lot, is in the car a lot. 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  He has a history of testosterone deficiency and is on testosterone replacement. He states that the testosterone helps with his energy, libido, muscle mass. Lab Results  Component Value Date   TESTOSTERONE 214 (L) 05/04/2016   Occ lower back  pain, has seen chiropractor and has done at home stretches, will having back issues, will have occ bilateral tingling lateral legs, very rare, will only go down to mid hip. Ibuprofen, tylenol can help. Would like referral to ortho, declines PT referral.  BMI is There is no height or weight on file to calculate BMI., he is working on diet and exercise. Wt Readings from Last 3 Encounters:  12/17/16 196 lb 9.6 oz (89.2 kg)  08/26/16 189 lb 3.2 oz (85.8 kg)  05/04/16 189 lb 9.6 oz (86 kg)    Current Medications:  Current Outpatient Prescriptions on File Prior to Visit  Medication Sig  . albuterol (PROVENTIL HFA;VENTOLIN HFA) 108 (90 Base) MCG/ACT inhaler Inhale 2 puffs into the lungs every 6 (six) hours as needed for wheezing or shortness of breath.  Marland Kitchen buPROPion (WELLBUTRIN XL) 150 MG 24 hr tablet TAKE 1 TABLET(150 MG) BY MOUTH EVERY MORNING  . Phosphatidylserine-DHA-EPA (VAYARIN) 75-21.5-8.5 MG CAPS 2 pills a day  . predniSONE (DELTASONE) 20 MG tablet TAKE 2 TABLETS BY MOUTH DAILY FOR 3 DAYS, THEN 1 TABLET BY MOUTH DAILY FOR 4 DAYS  . promethazine-dextromethorphan (PROMETHAZINE-DM) 6.25-15 MG/5ML syrup Take 5 mLs by mouth 4 (four) times daily  as needed for cough.  . Testosterone (ANDROGEL PUMP) 20.25 MG/ACT (1.62%) GEL 2 pumps each arm daily, (4 pumps total)  . traMADol (ULTRAM) 50 MG tablet TAKE 1 TABLET BY MOUTH TWICE DAILY AS NEEDED  . Vitamin D, Ergocalciferol, (DRISDOL) 50000 units CAPS capsule TAKE 1 CAPSULE BY MOUTH 2 DAYS PER WEEK   No current facility-administered medications on file prior to visit.    Health Maintenance:   TD/TDAP: 2015 Influenza: 2015 Pneumovax: N/A Prevnar 13: N/A  Sexually Active: yes, wife  Last Dental Exam: Dr. Ninetta Lights, q 6 months Last Eye Exam: None  Medical History:  Past Medical History:  Diagnosis Date  . Hypertension    Allergies Allergies  Allergen Reactions  . Flexeril [Cyclobenzaprine]     SURGICAL HISTORY He  has no past surgical  history on file. FAMILY HISTORY His family history includes Diabetes in his paternal grandfather; Heart disease (age of onset: 85) in his father; Hyperlipidemia in his father; Hypertension in his father. SOCIAL HISTORY He  reports that he has never smoked. He has never used smokeless tobacco. He reports that he drinks about 8.4 oz of alcohol per week . He reports that he does not use drugs.  Review of Systems: Review of Systems  Constitutional: Positive for malaise/fatigue. Negative for chills, diaphoresis, fever and weight loss.  HENT: Negative for congestion, ear discharge, ear pain, hearing loss, nosebleeds, sore throat and tinnitus.   Eyes: Negative.   Respiratory: Negative.  Negative for stridor.   Cardiovascular: Negative.   Gastrointestinal: Negative for abdominal pain, blood in stool, constipation, diarrhea, heartburn, melena, nausea and vomiting.  Genitourinary: Negative.   Musculoskeletal: Negative for back pain, falls, joint pain, myalgias and neck pain.  Skin: Negative.   Neurological: Negative for dizziness, tingling, tremors, sensory change, speech change, focal weakness, seizures, loss of consciousness, weakness and headaches (has TMJ, needs mouth guard).  Endo/Heme/Allergies: Negative.   Psychiatric/Behavioral: Negative for depression, hallucinations, memory loss, substance abuse and suicidal ideas. The patient is not nervous/anxious and does not have insomnia.    Physical Exam: Estimated body mass index is 29.03 kg/m as calculated from the following:   Height as of 12/17/16: 5\' 9"  (1.753 m).   Weight as of 12/17/16: 196 lb 9.6 oz (89.2 kg). There were no vitals taken for this visit. General Appearance: Well nourished, in no apparent distress.  Eyes: PERRLA, EOMs, conjunctiva no swelling or erythema, normal fundi and vessels.  Sinuses: No Frontal/maxillary tenderness  ENT/Mouth: Ext aud canals clear, normal light reflex with TMs without erythema, bulging. Good dentition.  No erythema, swelling, or exudate on post pharynx. Tonsils not swollen or erythematous. Hearing normal.  Neck: Supple, thyroid normal. No bruits  Respiratory: Respiratory effort normal, BS equal bilaterally without rales, rhonchi, wheezing or stridor.  Cardio: RRR without murmurs, rubs or gallops. Brisk peripheral pulses without edema.  Chest: symmetric, with normal excursions and percussion.  Abdomen: Soft, nontender, no guarding, rebound, hernias, masses, or organomegaly.  Lymphatics: Non tender without lymphadenopathy.  Genitourinary: defer Musculoskeletal: Full ROM all peripheral extremities,5/5 strength, and normal gait. Negative straight leg.   Skin: Warm, dry without rashes, lesions, ecchymosis. Neuro: Cranial nerves intact, reflexes equal bilaterally. Normal muscle tone, no cerebellar symptoms. Sensation intact.  Psych: Awake and oriented X 3, normal affect, Insight and Judgment appropriate.   EKG: WNL, IRBBB  Quentin Mulling 7:45 AM [facility]   This encounter was created in error - please disregard.

## 2017-05-18 ENCOUNTER — Encounter: Payer: BLUE CROSS/BLUE SHIELD | Admitting: Physician Assistant

## 2017-05-21 ENCOUNTER — Other Ambulatory Visit: Payer: Self-pay | Admitting: Physician Assistant

## 2017-05-23 ENCOUNTER — Encounter: Payer: Self-pay | Admitting: Physician Assistant

## 2017-05-24 NOTE — Telephone Encounter (Signed)
Tramadol called into pharmacy on Aug 27th 2018 by DD

## 2017-06-30 ENCOUNTER — Other Ambulatory Visit: Payer: Self-pay | Admitting: Physician Assistant

## 2017-07-01 ENCOUNTER — Encounter: Payer: Self-pay | Admitting: Physician Assistant

## 2017-07-01 NOTE — Telephone Encounter (Signed)
Tramadol called into pharmacy on 10.4.18 by DD

## 2017-09-09 ENCOUNTER — Ambulatory Visit (INDEPENDENT_AMBULATORY_CARE_PROVIDER_SITE_OTHER): Payer: BLUE CROSS/BLUE SHIELD | Admitting: Physician Assistant

## 2017-09-09 ENCOUNTER — Encounter: Payer: Self-pay | Admitting: Physician Assistant

## 2017-09-09 VITALS — BP 130/90 | HR 82 | Temp 97.4°F | Resp 14 | Ht 69.0 in | Wt 197.8 lb

## 2017-09-09 DIAGNOSIS — E291 Testicular hypofunction: Secondary | ICD-10-CM

## 2017-09-09 DIAGNOSIS — M545 Low back pain: Secondary | ICD-10-CM

## 2017-09-09 DIAGNOSIS — R6889 Other general symptoms and signs: Secondary | ICD-10-CM

## 2017-09-09 DIAGNOSIS — Z79899 Other long term (current) drug therapy: Secondary | ICD-10-CM

## 2017-09-09 DIAGNOSIS — I10 Essential (primary) hypertension: Secondary | ICD-10-CM | POA: Diagnosis not present

## 2017-09-09 DIAGNOSIS — G8929 Other chronic pain: Secondary | ICD-10-CM

## 2017-09-09 DIAGNOSIS — Z0000 Encounter for general adult medical examination without abnormal findings: Secondary | ICD-10-CM

## 2017-09-09 DIAGNOSIS — Z0001 Encounter for general adult medical examination with abnormal findings: Secondary | ICD-10-CM | POA: Diagnosis not present

## 2017-09-09 DIAGNOSIS — G4730 Sleep apnea, unspecified: Secondary | ICD-10-CM

## 2017-09-09 DIAGNOSIS — E559 Vitamin D deficiency, unspecified: Secondary | ICD-10-CM | POA: Diagnosis not present

## 2017-09-09 DIAGNOSIS — Z13220 Encounter for screening for lipoid disorders: Secondary | ICD-10-CM

## 2017-09-09 DIAGNOSIS — Z136 Encounter for screening for cardiovascular disorders: Secondary | ICD-10-CM

## 2017-09-09 DIAGNOSIS — Z6829 Body mass index (BMI) 29.0-29.9, adult: Secondary | ICD-10-CM

## 2017-09-09 NOTE — Progress Notes (Signed)
Complete Physical  Assessment and Plan:  Hypogonadism in male Hypogonadism-  check testosterone levels as needed.   Bilateral low back pain without sciatica - no red flags, declines PT  Vitamin D deficiency -     VITAMIN D 25 Hydroxy (Vit-D Deficiency, Fractures)  Essential hypertension -     CBC with Differential/Platelet -     BASIC METABOLIC PANEL WITH GFR -     Hepatic function panel -     TSH -     Urinalysis, Routine w reflex microscopic (not [facility]) -     Microalbumin / creatinine urine ratio -     EKG 12-Lead  Routine general medical examination at a health care facility 1 year  Medication management -     Magnesium Dale Conley was seen today for annual exam and sleep apnea.  BMI 29.0-29.9,adult  Sleep apnea, unspecified type Ref to Dr. Marylou Flesherohmier, + fatigue, snoring, witnessed apnea, HA in AM ? RLS/pain related for fatigue but rule out OSA  Screening cholesterol level -     Lipid panel  Discussed med's effects and SE's. Screening labs and tests as requested with regular follow-up as recommended. Over 40 minutes of exam, counseling, chart review and critical decision making was performed Future Appointments  Date Time Provider Department Center  09/22/2018  3:00 PM Quentin Mullingollier, Zelma Mazariego, PA-C [facility] None     HPI  This very nice 41 y.o.male presents for complete physical.  Patient has no major health issues.    He states he feels that he is constantly tired, fatigue in the morning, he snores, wife has witnessed apnea, HA in the AM with waking. Does watch TV before bed.   He does not workout anymore due to heat and new baby. He has a history of elevated BP, worse when he comes to the doctors, has checked at home and it is okay, BP today is BP: 130/90(left arm)  Finally, patient has history of Vitamin D Deficiency and last vitamin D was  Currently on supplementation.  Lab Results  Component Value Date   VD25OH 29 (L) 05/04/2016   Lab Results  Component Value  Date   CHOL 218 (H) 05/04/2016   HDL 79 05/04/2016   LDLCALC 111 05/04/2016   TRIG 140 05/04/2016   CHOLHDL 2.8 05/04/2016   Married with new baby girl, she is 3 Year and 1.7 months old, [place], [place]. Works for [employer], travels a lot, is in the car a lot. Just transferred to [employer], doing ME, taking 12 credits a semester, likely 2020. He does not take vayarin regularly, gets from pharmacy, takes as needed.  He has a history of testosterone deficiency, it not on anything, states it makes him irritable.  Lab Results  Component Value Date   TESTOSTERONE 214 (L) 05/04/2016   Occ lower back pain, has seen chiropractor and has done at home stretches, will having back issues, will have occ bilateral tingling lateral legs, very rare, will only go down to mid hip. Ibuprofen, tylenol can help. Will occ take tramdol, [place] long discussion that if any overuse will send to ortho/pain management.  BMI is Body mass index is 29.21 kg/m., he is working on diet and exercise. Wt Readings from Last 3 Encounters:  09/09/17 197 lb 12.8 oz (89.7 kg)  12/17/16 196 lb 9.6 oz (89.2 kg)  08/26/16 189 lb 3.2 oz (85.8 kg)    Current Medications:  Current Outpatient Medications on File Prior to Visit  Medication Sig Dispense Refill  . albuterol (PROVENTIL HFA;VENTOLIN HFA) 108 (90 Base) MCG/ACT inhaler Inhale 2 puffs into the lungs every 6 (six) hours as needed for wheezing or shortness of breath. 1 Inhaler 2  . buPROPion (WELLBUTRIN XL) 150 MG 24 hr tablet TAKE 1 TABLET(150 MG) BY MOUTH EVERY MORNING 90 tablet 0  . Phosphatidylserine-DHA-EPA (VAYARIN) 75-21.5-8.5 MG CAPS 2 pills a day 60 capsule 3  . traMADol (ULTRAM) 50 MG tablet TAKE 1 TABLET BY MOUTH TWICE DAILY AS NEEDED 60 tablet 0  . Vitamin D, Ergocalciferol, (DRISDOL) 50000 units CAPS capsule TAKE 1 CAPSULE BY MOUTH 2 DAYS PER WEEK 30 capsule 0   No current facility-administered medications on file prior to visit.     Health Maintenance:   TD/TDAP: 2015 Influenza: 2015 Pneumovax: N/A Prevnar 13: N/A  Sexually Active: yes, wife  Last Dental Exam: Dr. Ninetta LightsHatcher, q 6 months Last Eye Exam: None  Medical History:  Past Medical History:  Diagnosis Date  . Hypertension    Allergies Allergies  Allergen Reactions  . Flexeril [Cyclobenzaprine]     SURGICAL HISTORY He  has no past surgical history on file. FAMILY HISTORY His family history includes Diabetes in his paternal grandfather; Heart disease (age of onset: 3250) in his father; Hyperlipidemia in his father; Hypertension in his father. SOCIAL HISTORY He  reports that  has never smoked. he has never used smokeless tobacco. He reports that he drinks about 8.4 oz of alcohol per week. He reports that he does not use drugs.  Review of Systems: Review of Systems  Constitutional: Positive for malaise/fatigue. Negative for chills, diaphoresis, fever and weight loss.  HENT: Negative for congestion, ear discharge, ear pain, hearing loss, nosebleeds, sore throat and tinnitus.   Eyes: Negative.   Respiratory: Negative.  Negative for stridor.   Cardiovascular: Negative.   Gastrointestinal: Positive for heartburn. Negative for abdominal pain, blood in stool, constipation, diarrhea, melena, nausea and vomiting.  Genitourinary: Negative.   Musculoskeletal: Positive for back pain. Negative for falls, joint pain, myalgias and neck pain.  Skin: Negative.   Neurological: Negative for dizziness, tingling, tremors, sensory change, speech change, focal weakness, seizures, loss of consciousness, weakness and headaches (has TMJ, needs mouth guard).  Endo/Heme/Allergies: Negative.   Psychiatric/Behavioral: Negative for depression, hallucinations, memory loss, substance abuse and suicidal ideas. The patient has insomnia. The patient is not nervous/anxious.    Physical Exam: Estimated body mass index is 29.21 kg/m as calculated from the following:   Height as of this  encounter: 5\' 9"  (1.753 m).   Weight as of this encounter: 197 lb 12.8 oz (89.7 kg). BP 130/90 Comment: left arm  Pulse 82   Temp (!) 97.4 F (36.3 C)   Resp 14   Ht 5\' 9"  (1.753 m)   Wt 197 lb 12.8 oz (89.7 kg)   SpO2 97%   BMI 29.21 kg/m  General Appearance: Well nourished, in no apparent distress.  Eyes: PERRLA, EOMs, conjunctiva no swelling or erythema, normal fundi and vessels.  Sinuses: No Frontal/maxillary tenderness  ENT/Mouth: Ext aud canals clear, normal light reflex with TMs without erythema, bulging. Good dentition. No erythema, swelling, or exudate on post pharynx. Tonsils not swollen or erythematous. Hearing normal.  Neck: Supple, thyroid normal. No bruits  Respiratory: Respiratory effort normal, BS equal bilaterally without rales, rhonchi, wheezing or stridor.  Cardio: RRR without murmurs, rubs or gallops. Brisk peripheral pulses without edema.  Chest: symmetric, with normal excursions and percussion.  Abdomen: Soft, nontender, no guarding,  rebound, hernias, masses, or organomegaly.  Lymphatics: Non tender without lymphadenopathy.  Genitourinary: defer Musculoskeletal: Full ROM all peripheral extremities,5/5 strength, and normal gait. Negative straight leg.   Skin: Warm, dry without rashes, lesions, ecchymosis. Neuro: Cranial nerves intact, reflexes equal bilaterally. Normal muscle tone, no cerebellar symptoms. Sensation intact.  Psych: Awake and oriented X 3, normal affect, Insight and Judgment appropriate.   EKG: WNL, IRBBB  Quentin Mulling 4:13 PM [facility]

## 2017-09-09 NOTE — Patient Instructions (Addendum)
11 Tips to Follow:  1. No caffeine after 3pm: Avoid beverages with caffeine (soda, tea, energy drinks, etc.) especially after 3pm. 2. Don't go to bed hungry: Have your evening meal at least 3 hrs. before going to sleep. It's fine to have a small bedtime snack such as a glass of milk and a few crackers but don't have a big meal. 3. Have a nightly routine before bed: Plan on "winding down" before you go to sleep. Begin relaxing about 1 hour before you go to bed. Try doing a quiet activity such as listening to calming music, reading a book or meditating. 4. Turn off the TV and ALL electronics including video games, tablets, laptops, etc. 1 hour before sleep, and keep them out of the bedroom. 5. Turn off your cell phone and all notifications (new email and text alerts) or even better, leave your phone outside your room while you sleep. Studies have shown that a part of your brain continues to respond to certain lights and sounds even while you're still asleep. 6. Make your bedroom quiet, dark and cool. If you can't control the noise, try wearing earplugs or using a fan to block out other sounds. 7. Practice relaxation techniques. Try reading a book or meditating or drain your brain by writing a list of what you need to do the next day. 8. Don't nap unless you feel sick: you'll have a better night's sleep. 9. Don't smoke, or quit if you do. Nicotine, alcohol, and marijuana can all keep you awake. Talk to your health care provider if you need help with substance use. 10. Most importantly, wake up at the same time every day (or within 1 hour of your usual wake up time) EVEN on the weekends. A regular wake up time promotes sleep hygiene and prevents sleep problems. 11. Reduce exposure to bright light in the last three hours of the day before going to sleep. Maintaining good sleep hygiene and having good sleep habits lower your risk of developing sleep problems. Getting better sleep can also improve your  concentration and alertness. Try the simple steps in this guide. If you still have trouble getting enough rest, make an appointment with your health care provider.  I think it is possible that you have sleep apnea. It can cause interrupted sleep, headaches, frequent awakenings, fatigue, dry mouth, fast/slow heart beats, memory issues, anxiety/depression, swelling, numbness tingling hands/feet, weight gain, shortness of breath, and the list goes on. Sleep apnea needs to be ruled out because if it is left untreated it does eventually lead to abnormal heart beats, lung failure or heart failure as well as increasing the risk of heart attack and stroke. There are masks you can wear OR a mouth piece that I can give you information about. Often times though people feel MUCH better after getting treatment.   Sleep Apnea  Sleep apnea is a sleep disorder characterized by abnormal pauses in breathing while you sleep. When your breathing pauses, the level of oxygen in your blood decreases. This causes you to move out of deep sleep and into light sleep. As a result, your quality of sleep is poor, and the system that carries your blood throughout your body (cardiovascular system) experiences stress. If sleep apnea remains untreated, the following conditions can develop:  High blood pressure (hypertension).  Coronary artery disease.  Inability to achieve or maintain an erection (impotence).  Impairment of your thought process (cognitive dysfunction). There are three types of sleep apnea: 1. Obstructive sleep  apnea--Pauses in breathing during sleep because of a blocked airway. 2. Central sleep apnea--Pauses in breathing during sleep because the area of the brain that controls your breathing does not send the correct signals to the muscles that control breathing. 3. Mixed sleep apnea--A combination of both obstructive and central sleep apnea.  RISK FACTORS The following risk factors can increase your risk of  developing sleep apnea:  Being overweight.  Smoking.  Having narrow passages in your nose and throat.  Being of older age.  Being male.  Alcohol use.  Sedative and tranquilizer use.  Ethnicity. Among individuals younger than 35 years, African Americans are at increased risk of sleep apnea. SYMPTOMS   Difficulty staying asleep.  Daytime sleepiness and fatigue.  Loss of energy.  Irritability.  Loud, heavy snoring.  Morning headaches.  Trouble concentrating.  Forgetfulness.  Decreased interest in sex. DIAGNOSIS  In order to diagnose sleep apnea, your caregiver will perform a physical examination. Your caregiver may suggest that you take a home sleep test. Your caregiver may also recommend that you spend the night in a sleep lab. In the sleep lab, several monitors record information about your heart, lungs, and brain while you sleep. Your leg and arm movements and blood oxygen level are also recorded. TREATMENT The following actions may help to resolve mild sleep apnea:  Sleeping on your side.   Using a decongestant if you have nasal congestion.   Avoiding the use of depressants, including alcohol, sedatives, and narcotics.   Losing weight and modifying your diet if you are overweight. There also are devices and treatments to help open your airway:  Oral appliances. These are custom-made mouthpieces that shift your lower jaw forward and slightly open your bite. This opens your airway.  Devices that create positive airway pressure. This positive pressure "splints" your airway open to help you breathe better during sleep. The following devices create positive airway pressure:  Continuous positive airway pressure (CPAP) device. The CPAP device creates a continuous level of air pressure with an air pump. The air is delivered to your airway through a mask while you sleep. This continuous pressure keeps your airway open.  Nasal expiratory positive airway pressure  (EPAP) device. The EPAP device creates positive air pressure as you exhale. The device consists of single-use valves, which are inserted into each nostril and held in place by adhesive. The valves create very little resistance when you inhale but create much more resistance when you exhale. That increased resistance creates the positive airway pressure. This positive pressure while you exhale keeps your airway open, making it easier to breath when you inhale again.  Bilevel positive airway pressure (BPAP) device. The BPAP device is used mainly in patients with central sleep apnea. This device is similar to the CPAP device because it also uses an air pump to deliver continuous air pressure through a mask. However, with the BPAP machine, the pressure is set at two different levels. The pressure when you exhale is lower than the pressure when you inhale.  Surgery. Typically, surgery is only done if you cannot comply with less invasive treatments or if the less invasive treatments do not improve your condition. Surgery involves removing excess tissue in your airway to create a wider passage way. Document Released: 09/04/2002 Document Revised: 01/09/2013 Document Reviewed: 01/21/2012 [facility] Patient Information 2015 [place], [place]. This information is not intended to replace advice given to you by your health care provider. Make sure you discuss any questions you have with your  health care provider.   Can do zinc 40-50 mg a day Can try shake that is whey protein, almond milk, avocado oil, and spinach and strawberries.   9 Ways to Naturally Increase Testosterone Levels  1.   Lose Weight If you're overweight, shedding the excess pounds may increase your testosterone levels, according to research presented at the Endocrine Society's 2012 meeting. Overweight men are more likely to have low testosterone levels to begin with, so this is an important trick to increase your body's testosterone production when  you need it most.  2.   High-Intensity Exercise like Peak Fitness  Short intense exercise has a proven positive effect on increasing testosterone levels and preventing its decline. That's unlike aerobics or prolonged moderate exercise, which have shown to have negative or no effect on testosterone levels. Having a whey protein meal after exercise can further enhance the satiety/testosterone-boosting impact (hunger hormones cause the opposite effect on your testosterone and libido). Here's a summary of what a typical high-intensity Peak Fitness routine might look like: " Warm up for three minutes  " Exercise as hard and fast as you can for 30 seconds. You should feel like you couldn't possibly go on another few seconds  " Recover at a slow to moderate pace for 90 seconds  " Repeat the high intensity exercise and recovery 7 more times .  3.   Consume Plenty of Zinc The mineral zinc is important for testosterone production, and supplementing your diet for as little as six weeks has been shown to cause a marked improvement in testosterone among men with low levels.1 Likewise, research has shown that restricting dietary sources of zinc leads to a significant decrease in testosterone, while zinc supplementation increases it2 -- and even protects men from exercised-induced reductions in testosterone levels.3 It's estimated that up to 45 percent of adults over the age of 60 may have lower than recommended zinc intakes; even when dietary supplements were added in, an estimated 20-25 percent of older adults still had inadequate zinc intakes, according to a [employer] and Nutrition Examination Survey.4 Your diet is the best source of zinc; along with protein-rich foods like meats and fish, other good dietary sources of zinc include raw milk, raw cheese, beans, and yogurt or kefir made from raw milk. It can be difficult to obtain enough dietary zinc if you're a vegetarian, and also for meat-eaters as well,  largely because of conventional farming methods that rely heavily on chemical fertilizers and pesticides. These chemicals deplete the soil of nutrients ... nutrients like zinc that must be absorbed by plants in order to be passed on to you. In many cases, you may further deplete the nutrients in your food by the way you prepare it. For most food, cooking it will drastically reduce its levels of nutrients like zinc . particularly over-cooking, which many people do. If you decide to use a zinc supplement, stick to a dosage of less than 40 mg a day, as this is the recommended adult upper limit. Taking too much zinc can interfere with your body's ability to absorb other minerals, especially copper, and may cause nausea as a side effect.  4.   Strength Training In addition to Peak Fitness, strength training is also known to boost testosterone levels, provided you are doing so intensely enough. When strength training to boost testosterone, you'll want to increase the weight and lower your number of reps, and then focus on exercises that work a large number of muscles, such  as dead lifts or squats.  You can "turbo-charge" your weight training by going slower. By slowing down your movement, you're actually turning it into a high-intensity exercise. Super Slow movement allows your muscle, at the microscopic level, to access the maximum number of cross-bridges between the protein filaments that produce movement in the muscle.   5.   Optimize Your Vitamin D Levels Vitamin D, a steroid hormone, is essential for the healthy development of the nucleus of the sperm cell, and helps maintain semen quality and sperm count. Vitamin D also increases levels of testosterone, which may boost libido. In one study, overweight men who were given vitamin D supplements had a significant increase in testosterone levels after one year.5   6.   Reduce Stress When you're under a lot of stress, your body releases high levels of the  stress hormone cortisol. This hormone actually blocks the effects of testosterone,6 presumably because, from a biological standpoint, testosterone-associated behaviors (mating, competing, aggression) may have lowered your chances of survival in an emergency (hence, the "fight or flight" response is dominant, courtesy of cortisol).  7.   Limit or Eliminate Sugar from Your Diet Testosterone levels decrease after you eat sugar, which is likely because the sugar leads to a high insulin level, another factor leading to low testosterone.7 Based on USDA estimates, the average American consumes 12 teaspoons of sugar a day, which equates to about TWO TONS of sugar during a lifetime.  8.   Eat Healthy Fats By healthy, this means not only mon- and polyunsaturated fats, like that found in avocadoes and nuts, but also saturated, as these are essential for building testosterone. Research shows that a diet with less than 40 percent of energy as fat (and that mainly from animal sources, i.e. saturated) lead to a decrease in testosterone levels.8 My personal diet is about 60-70 percent healthy fat, and other experts agree that the ideal diet includes somewhere between 50-70 percent fat.  It's important to understand that your body requires saturated fats from animal and vegetable sources (such as meat, dairy, certain oils, and tropical plants like coconut) for optimal functioning, and if you neglect this important food group in favor of sugar, grains and other starchy carbs, your health and weight are almost guaranteed to suffer. Examples of healthy fats you can eat more of to give your testosterone levels a boost include: Olives and Olive oil  Coconuts and coconut oil Butter made from raw grass-fed organic milk Raw nuts, such as, almonds or pecans Organic pastured egg yolks Avocados Grass-fed meats Palm oil Unheated organic nut oils   9.   Boost Your Intake of Branch Chain Amino Acids (BCAA) from Foods Like Whey  Protein Research suggests that BCAAs result in higher testosterone levels, particularly when taken along with resistance training.9 While BCAAs are available in supplement form, you'll find the highest concentrations of BCAAs like leucine in dairy products - especially quality cheeses and whey protein. Even when getting leucine from your natural food supply, it's often wasted or used as a building block instead of an anabolic agent. So to create the correct anabolic environment, you need to boost leucine consumption way beyond mere maintenance levels. That said, keep in mind that using leucine as a free form amino acid can be highly counterproductive as when free form amino acids are artificially administrated, they rapidly enter your circulation while disrupting insulin function, and impairing your body's glycemic control. Food-based leucine is really the ideal form that can benefit your  muscles without side effects.   Monitor your blood pressure at home. Go to the ER if any CP, SOB, nausea, dizziness, severe HA, changes vision/speech  Goal BP:  For patients younger than 60: Goal BP < 140/90. For patients 60 and older: Goal BP < 150/90. For patients with diabetes: Goal BP < 140/90. Your most recent BP: BP: 130/90(left arm)   Take your medications faithfully as instructed. Maintain a healthy weight. Get at least 150 minutes of aerobic exercise per week. Minimize salt intake. Minimize alcohol intake  DASH Eating Plan DASH stands for "Dietary Approaches to Stop Hypertension." The DASH eating plan is a healthy eating plan that has been shown to reduce high blood pressure (hypertension). Additional health benefits may include reducing the risk of type 2 diabetes mellitus, heart disease, and stroke. The DASH eating plan may also help with weight loss. WHAT DO I NEED TO KNOW ABOUT THE DASH EATING PLAN? For the DASH eating plan, you will follow these general guidelines:  Choose foods with a percent  daily value for sodium of less than 5% (as listed on the food label).  Use salt-free seasonings or herbs instead of table salt or sea salt.  Check with your health care provider or pharmacist before using salt substitutes.  Eat lower-sodium products, often labeled as "lower sodium" or "no salt added."  Eat fresh foods.  Eat more vegetables, fruits, and low-fat dairy products.  Choose whole grains. Look for the word "whole" as the first word in the ingredient list.  Choose fish and skinless chicken or [place] more often than red meat. Limit fish, poultry, and meat to 6 oz (170 g) each day.  Limit sweets, desserts, sugars, and sugary drinks.  Choose heart-healthy fats.  Limit cheese to 1 oz (28 g) per day.  Eat more home-cooked food and less restaurant, buffet, and fast food.  Limit fried foods.  Cook foods using methods other than frying.  Limit canned vegetables. If you do use them, rinse them well to decrease the sodium.  When eating at a restaurant, ask that your food be prepared with less salt, or no salt if possible. WHAT FOODS CAN I EAT? Seek help from a dietitian for individual calorie needs. Grains Whole grain or whole wheat bread. Brown rice. Whole grain or whole wheat pasta. Quinoa, bulgur, and whole grain cereals. Low-sodium cereals. Corn or whole wheat flour tortillas. Whole grain cornbread. Whole grain crackers. Low-sodium crackers. Vegetables Fresh or frozen vegetables (raw, steamed, roasted, or grilled). Low-sodium or reduced-sodium tomato and vegetable juices. Low-sodium or reduced-sodium tomato sauce and paste. Low-sodium or reduced-sodium canned vegetables.  Fruits All fresh, canned (in natural juice), or frozen fruits. Meat and Other Protein Products Ground beef (85% or leaner), grass-fed beef, or beef trimmed of fat. Skinless chicken or [place]. Ground chicken or [place]. Pork trimmed of fat. All fish and seafood. Eggs. Dried beans, peas, or lentils. Unsalted  nuts and seeds. Unsalted canned beans. Dairy Low-fat dairy products, such as skim or 1% milk, 2% or reduced-fat cheeses, low-fat ricotta or cottage cheese, or plain low-fat yogurt. Low-sodium or reduced-sodium cheeses. Fats and Oils Tub margarines without trans fats. Light or reduced-fat mayonnaise and salad dressings (reduced sodium). Avocado. Safflower, olive, or canola oils. Natural peanut or almond butter. Other Unsalted popcorn and pretzels. The items listed above may not be a complete list of recommended foods or beverages. Contact your dietitian for more options. WHAT FOODS ARE NOT RECOMMENDED? Grains White bread. White pasta. White rice. Refined  cornbread. Bagels and croissants. Crackers that contain trans fat. Vegetables Creamed or fried vegetables. Vegetables in a cheese sauce. Regular canned vegetables. Regular canned tomato sauce and paste. Regular tomato and vegetable juices. Fruits Dried fruits. Canned fruit in light or heavy syrup. Fruit juice. Meat and Other Protein Products Fatty cuts of meat. Ribs, chicken wings, bacon, sausage, bologna, salami, chitterlings, fatback, hot dogs, bratwurst, and packaged luncheon meats. Salted nuts and seeds. Canned beans with salt. Dairy Whole or 2% milk, cream, half-and-half, and cream cheese. Whole-fat or sweetened yogurt. Full-fat cheeses or blue cheese. Nondairy creamers and whipped toppings. Processed cheese, cheese spreads, or cheese curds. Condiments Onion and garlic salt, seasoned salt, table salt, and sea salt. Canned and packaged gravies. Worcestershire sauce. Tartar sauce. Barbecue sauce. Teriyaki sauce. Soy sauce, including reduced sodium. Steak sauce. Fish sauce. Oyster sauce. Cocktail sauce. Horseradish. Ketchup and mustard. Meat flavorings and tenderizers. Bouillon cubes. Hot sauce. Tabasco sauce. Marinades. Taco seasonings. Relishes. Fats and Oils Butter, stick margarine, lard, shortening, ghee, and bacon fat. Coconut, palm  kernel, or palm oils. Regular salad dressings. Other Pickles and olives. Salted popcorn and pretzels. The items listed above may not be a complete list of foods and beverages to avoid. Contact your dietitian for more information. WHERE CAN I FIND MORE INFORMATION? National Heart, Lung, and Blood Institute: [employer].itwww.nhlbi.nih.gov/health/health-topics/topics/dash/ Document Released: 09/03/2011 Document Revised: 01/29/2014 Document Reviewed: 07/19/2013 [facility] Patient Information 2015 [place], [place]. This information is not intended to replace advice given to you by your health care provider. Make sure you discuss any questions you have with your health care provider.

## 2017-09-10 LAB — HEPATIC FUNCTION PANEL
AG RATIO: 1.7 (calc) (ref 1.0–2.5)
ALKALINE PHOSPHATASE (APISO): 61 U/L (ref 40–115)
ALT: 21 U/L (ref 9–46)
AST: 19 U/L (ref 10–40)
Albumin: 4.6 g/dL (ref 3.6–5.1)
BILIRUBIN DIRECT: 0.1 mg/dL (ref 0.0–0.2)
BILIRUBIN INDIRECT: 0.3 mg/dL (ref 0.2–1.2)
GLOBULIN: 2.7 g/dL (ref 1.9–3.7)
TOTAL PROTEIN: 7.3 g/dL (ref 6.1–8.1)
Total Bilirubin: 0.4 mg/dL (ref 0.2–1.2)

## 2017-09-10 LAB — LIPID PANEL
CHOLESTEROL: 253 mg/dL — AB (ref <200)
HDL: 73 mg/dL (ref >40)
LDL Cholesterol (Calc): 154 mg/dL (calc) — ABNORMAL HIGH
NON-HDL CHOLESTEROL (CALC): 180 mg/dL — AB (ref <130)
Total CHOL/HDL Ratio: 3.5 (calc) (ref <5.0)
Triglycerides: 139 mg/dL (ref <150)

## 2017-09-10 LAB — VITAMIN D 25 HYDROXY (VIT D DEFICIENCY, FRACTURES): Vit D, 25-Hydroxy: 24 ng/mL — ABNORMAL LOW (ref 30–100)

## 2017-09-10 LAB — CBC WITH DIFFERENTIAL/PLATELET
BASOS ABS: 32 {cells}/uL (ref 0–200)
BASOS PCT: 0.5 %
Eosinophils Absolute: 243 cells/uL (ref 15–500)
Eosinophils Relative: 3.8 %
HCT: 42.2 % (ref 38.5–50.0)
Hemoglobin: 14.9 g/dL (ref 13.2–17.1)
Lymphs Abs: 2522 cells/uL (ref 850–3900)
MCH: 32.4 pg (ref 27.0–33.0)
MCHC: 35.3 g/dL (ref 32.0–36.0)
MCV: 91.7 fL (ref 80.0–100.0)
MONOS PCT: 10.2 %
MPV: 9 fL (ref 7.5–12.5)
NEUTROS PCT: 46.1 %
Neutro Abs: 2950 cells/uL (ref 1500–7800)
PLATELETS: 277 10*3/uL (ref 140–400)
RBC: 4.6 10*6/uL (ref 4.20–5.80)
RDW: 11.9 % (ref 11.0–15.0)
TOTAL LYMPHOCYTE: 39.4 %
WBC: 6.4 10*3/uL (ref 3.8–10.8)
WBCMIX: 653 {cells}/uL (ref 200–950)

## 2017-09-10 LAB — TSH: TSH: 1.83 m[IU]/L (ref 0.40–4.50)

## 2017-09-10 LAB — MICROALBUMIN / CREATININE URINE RATIO
CREATININE, URINE: 85 mg/dL (ref 20–320)
Microalb Creat Ratio: 4 mcg/mg creat (ref <30)
Microalb, Ur: 0.3 mg/dL

## 2017-09-10 LAB — BASIC METABOLIC PANEL WITH GFR
BUN: 18 mg/dL (ref 7–25)
CHLORIDE: 103 mmol/L (ref 98–110)
CO2: 27 mmol/L (ref 20–32)
Calcium: 9.8 mg/dL (ref 8.6–10.3)
Creat: 1.08 mg/dL (ref 0.60–1.35)
GFR, EST AFRICAN AMERICAN: 98 mL/min/{1.73_m2} (ref >=60)
GFR, Est Non African American: 85 mL/min/{1.73_m2} (ref >=60)
GLUCOSE: 87 mg/dL (ref 65–99)
Potassium: 4.3 mmol/L (ref 3.5–5.3)
SODIUM: 138 mmol/L (ref 135–146)

## 2017-09-10 LAB — URINALYSIS, ROUTINE W REFLEX MICROSCOPIC
Bilirubin Urine: NEGATIVE
Glucose, UA: NEGATIVE
HGB URINE DIPSTICK: NEGATIVE
KETONES UR: NEGATIVE
Leukocytes, UA: NEGATIVE
NITRITE: NEGATIVE
PROTEIN: NEGATIVE
Specific Gravity, Urine: 1.018 (ref 1.001–1.03)
pH: 7 (ref 5.0–8.0)

## 2017-09-10 LAB — MAGNESIUM: Magnesium: 2.2 mg/dL (ref 1.5–2.5)

## 2017-09-16 ENCOUNTER — Encounter: Payer: Self-pay | Admitting: *Deleted

## 2017-09-16 ENCOUNTER — Encounter: Payer: Self-pay | Admitting: Neurology

## 2017-09-16 ENCOUNTER — Ambulatory Visit: Payer: BLUE CROSS/BLUE SHIELD | Admitting: Neurology

## 2017-09-16 VITALS — BP 131/92 | HR 86 | Ht 69.0 in | Wt 196.5 lb

## 2017-09-16 DIAGNOSIS — R0683 Snoring: Secondary | ICD-10-CM

## 2017-09-16 DIAGNOSIS — G4733 Obstructive sleep apnea (adult) (pediatric): Secondary | ICD-10-CM

## 2017-09-16 NOTE — Progress Notes (Signed)
SLEEP MEDICINE CLINIC   Provider:  Melvyn Novas, M D  Primary Care Physician:  Dale Cowboy, MD   Referring Provider: Lucky Cowboy, MD   Chief Complaint  Patient presents with  . New Patient (Initial Visit)    sleep consult    HPI:  Dale Conley is a 41 y.o. male , seen here in a referral from Dr. Oneta Rack for an evaluation for possible sleep apnea.  Dale Conley is a 41 year old Caucasian, right-handed gentleman who presents with a limited list of diagnoses, he has been taking as needed tramadol for pain, he is on vitamin D supplements, Wellbutrin only had a 50 mg in the morning, and an albuterol inhaler as needed for wheezing and shortness of breath.  But this medication was only prescribed during viral infection and is not a ongoing prescription.  He also takes Vyarin, a medication that is supposed to help him focus.   Chief complaint according to patient :  Sleep habits are as follows: Patient usually goes to bed between 10 PM and midnight this varies by his degree of sleepiness.  He usually watches TV before he goes to bed.  He also watches TV in bed.  He put his TV on a timer at the bedroom is cool, quiet and dark.  Patient feels that the TV is necessary for him fall asleep, prevents the TV from running throughout the night. He falls usually asleep in a supine position, 2 pillows for head support.  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    Sleep medical history and family sleep history:    Social history: Patient is married, father of a 107-year-old girl, used to smoke but quit in 2008, alcohol use socially, caffeine use daily-form of tea sometimes iced coffee, usually not soda, he also uses some Monster energy drinks. 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    Review  of Systems: Out of a complete 14 system review, the patient complains of only the following symptoms, and all other reviewed systems are negative. Mr. [name] reports that he wakes up frequently with the right hand being numb but that this numbness resolves as the day goes by.   snoring.,  Witnessed apnea, Epworth score 3 , Fatigue severity score 41  , depression score n/a    Social History   Socioeconomic History  . Marital status: Single    Spouse name: Not on file  . Number of children: Not on file  . Years of education: Not on file  . Highest education level: Not on file  Social Needs  . Financial resource strain: Not on file  . Food insecurity - worry: Not on file  . Food insecurity - inability: Not on file  . Transportation needs - medical: Not on file  . Transportation needs - non-medical: Not on file  Occupational History  . Not on file  Tobacco Use  . Smoking status: Never Smoker  . Smokeless tobacco: Never Used  Substance and Sexual Activity  . Alcohol use: Yes    Alcohol/week: 8.4 oz    Types: 14 Standard drinks or equivalent per week    Comment: beer  . Drug use: No  . Sexual activity: Yes  Other Topics Concern  . Not on file  Social History Narrative  . Not on file    Family History  Problem Relation Age of Onset  . Heart disease Father 10250       CABG  . Hypertension Father   . Hyperlipidemia Father   . Diabetes Paternal Grandfather     Past Medical History:  Diagnosis Date  . Hypertension     No past surgical history on file.  Current Outpatient Medications  Medication Sig Dispense Refill  . Phosphatidylserine-DHA-EPA (VAYARIN) 75-21.5-8.5 MG CAPS 2 pills a day 60 capsule 3  . traMADol (ULTRAM) 50 MG tablet TAKE 1 TABLET BY MOUTH TWICE DAILY AS NEEDED 60 tablet 0  . Vitamin D, Ergocalciferol, (DRISDOL) 50000 units CAPS capsule TAKE 1 CAPSULE BY MOUTH 2 DAYS PER WEEK 30 capsule 0   No current facility-administered medications for this visit.      Allergies as of 09/16/2017 - Review Complete 09/16/2017  Allergen Reaction Noted  . Flexeril [cyclobenzaprine]  05/01/2015    Vitals: BP (!) 131/92   Pulse 86   Ht 5\' 9"  (1.753 m)   Wt 196 lb 8 oz (89.1 kg)   BMI 29.02 kg/m  Last Weight:  Wt Readings from Last 1 Encounters:  09/16/17 196 lb 8 oz (89.1 kg)   UEA:VWUJBMI:Body mass index is 29.02 kg/m.     Last Height:   Ht Readings from Last 1 Encounters:  09/16/17 5\' 9"  (1.753 m)    Physical exam:  General: The patient is awake, alert and appears not in acute distress. The patient is well groomed. Head: Normocephalic, atraumatic. Neck is supple. Mallampati 4,  neck circumference: 17. Nasal airflow patent , TMJ reported  . Retrognathia is not seen.  Cardiovascular:  Regular rate and rhythm, without  murmurs or carotid bruit, and without distended neck veins. Respiratory: Lungs are clear to auscultation. Skin:  Without evidence of edema, or rash Trunk: Neurologic exam : The patient is awake and alert, oriented to place and time.   Memory subjective  described as intact. Attention span & concentration ability appears normal.  Speech is fluent,  Without  dysarthria, dysphonia or aphasia.  Mood and affect are appropriate.  Cranial nerves: Pupils are equal and briskly reactive to light. Funduscopic exam without evidence of pallor or edema. Extraocular movements  in vertical and horizontal planes intact and without nystagmus. Visual fields by finger perimetry are intact.Hearing to finger rub intact.  Facial sensation intact to fine touch. Facial motor strength is symmetric and tongue and uvula move midline. Shoulder shrug was symmetrical.  Motor exam:  Normal tone, muscle bulk and symmetric strength in all extremities. Sensory:  Fine touch, pinprick and vibration were tested in all extremities. Proprioception tested in the upper extremities was normal. Coordination: Rapid alternating movements in the fingers/hands was normal.  Finger-to-nose maneuver  normal without evidence of ataxia, dysmetria or tremor. Gait and station: Patient walks without assistive device and is able unassisted to climb up to the exam table. Strength within normal limits.Stance is stable and normal.  Toe and hell stand were tested . Deep tendon reflexes: in the upper and lower extremities are symmetric and intact. Babinski maneuver response is downgoing.  Assessment:  After physical and neurologic examination, review of laboratory studies,  Personal review of imaging studies, reports of other /same  Imaging studies, results of polysomnography and / or neurophysiology testing and pre-existing records as far as provided in visit., my assessment is   1) Mr. [name] is a 41 year old otherwise very healthy individual with  some elevated fatigue scores but he is not feeling truly daytime sleep.  Observation of friends, his wife and family members that he is snoring loudly and has frequently presented with apnea he does not have significant comorbidities a diabetic nor does he have any history of heart disease, pulmonary disease or intractable hypertension.  He has never had irregular heartbeats Based on the lack of comorbidities home sleep test rather than an attended sleep study I would like for him to use a watch Pat likely to interfere with his changes in sleep positions at night.  The patient was advised of the nature of the diagnosed disorder , the treatment options and the  risks for general health and wellness arising from not treating the condition.   I spent more than 45 minutes of face to face time with the patient.  Greater than 50% of time was spent in counseling and coordination of care. We have discussed the diagnosis and differential and I answered the patient's questions.    Plan:  Treatment plan and additional workup : HST with watch pat.    Melvyn NovasARMEN Aseel Uhde, MD 09/16/2017, 9:04 AM  Certified in Neurology by ABPN Certified in Sleep  Medicine by [facility]  [facility] [address], [address] [place], [place] [address]

## 2017-09-16 NOTE — Patient Instructions (Signed)

## 2017-10-13 ENCOUNTER — Ambulatory Visit (INDEPENDENT_AMBULATORY_CARE_PROVIDER_SITE_OTHER): Payer: BLUE CROSS/BLUE SHIELD | Admitting: Neurology

## 2017-10-13 DIAGNOSIS — G4733 Obstructive sleep apnea (adult) (pediatric): Secondary | ICD-10-CM | POA: Diagnosis not present

## 2017-10-13 DIAGNOSIS — R0683 Snoring: Secondary | ICD-10-CM

## 2017-10-18 ENCOUNTER — Other Ambulatory Visit: Payer: Self-pay | Admitting: Neurology

## 2017-10-18 DIAGNOSIS — R5383 Other fatigue: Secondary | ICD-10-CM

## 2017-10-18 DIAGNOSIS — G4733 Obstructive sleep apnea (adult) (pediatric): Secondary | ICD-10-CM

## 2017-10-18 DIAGNOSIS — R0683 Snoring: Secondary | ICD-10-CM

## 2017-10-18 DIAGNOSIS — G479 Sleep disorder, unspecified: Secondary | ICD-10-CM

## 2017-10-18 NOTE — Procedures (Signed)
[facility] [address] [place], [place] [address] NAME:  Dale GunningJeremy L. Perotti                                                        DOB: 23-May-1976 MEDICAL RECORD No: [id number]                                        DOS: 10/13/2017 REFERRING PHYSICIAN: Lucky CowboyWilliam McKeown, MD STUDY PERFORMED: Home Sleep Test on watch pat  HISTORY: Mr. [name] is a 42 year old Caucasian, right-handed gentleman who presents with a limited list of diagnoses, to be evaluated for fatigue and snoring, rule out OSA. He has been taking as needed tramadol for pain, takes vitamin D supplement, Wellbutrin 50 mg in the morning, and an albuterol inhaler as needed for wheezing and shortness of breath. He also takes a medication that is supposed to help him focus. 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 BMI 29.1. Epworth Sleepiness Score endorsed at 3 points, Fatigue severity score at 41.     STUDY RESULTS: Total Recording Time: 8 hours, 20 minutes Total Apnea/Hypopnea Index (AHI): 33.1 /hr. RDI was 40.6/hr., snoring between 40 and 50 decibel.  Average Oxygen Saturation:  94 %, Lowest Oxygen Desaturation: 88 %  Total Time Oxygen Saturation Below 89 % Sp02:   0.0 minutes  Average Heart Rate:  59 bpm, 40- 88 bpm, NSR.   IMPRESSION: 1) Moderately Severe Sleep Apnea, Obstructive (OSA). 2) Very loud Snoring with additional Respiratory Arousals (RDI). 3) Hypoxemia was not present.    RECOMMENDATION: This degree of snoring and apnea would be best addressed with CPAP, especially in a patient with TMJ (dental device may not benefit him)  I will order an auto CPAP 5-15 cm water, 2 cm EPR and heated humidity, mask of choice.  I certify that I have reviewed the raw data recording prior to the issuance of this report in accordance with the standards of the American Academy of Sleep Medicine (AASM). Melvyn Novasarmen Aishi Courts, M.D.        10-17-2017 Medical Director of [facility],  Diplomat of the ABPN  and ABSM, and accredited by the AASM

## 2017-10-19 ENCOUNTER — Telehealth: Payer: Self-pay | Admitting: Neurology

## 2017-10-19 NOTE — Telephone Encounter (Addendum)
Pt returned call. I advised pt that Dr. Vickey Hugerohmeier reviewed their sleep study results and found that pt had sleep apnea. Dr. Vickey Hugerohmeier recommends that pt starts auto CPAP. I reviewed PAP compliance expectations with the pt. Pt is agreeable to starting a CPAP. I advised pt that an order will be sent to a DME, [facility], and [facility] will call the pt within about one week after they file with the pt's insurance. [facility] will show the pt how to use the machine, fit for masks, and troubleshoot the CPAP if needed. A follow up appt was made for insurance purposes with Darrol Angelarolyn Martin, NP on January 10, 2018 at 7:45 am. Pt verbalized understanding to arrive 15 minutes early and bring their CPAP. A letter with all of this information in it will be mailed to the pt as a reminder. I verified with the pt that the address we have on file is correct. Pt verbalized understanding of results. Pt had no questions at this time but was encouraged to call back if questions arise.

## 2017-10-19 NOTE — Telephone Encounter (Signed)
Called patient to discuss sleep study results. No answer at this time. LVM for the patient to call back.   

## 2017-11-23 DIAGNOSIS — G4733 Obstructive sleep apnea (adult) (pediatric): Secondary | ICD-10-CM | POA: Diagnosis not present

## 2017-11-25 ENCOUNTER — Telehealth: Payer: Self-pay | Admitting: Neurology

## 2017-11-25 NOTE — Telephone Encounter (Signed)
Received this notification from [facility]   "I called this pt to follow up and he let me know he decided to go with the mouth piece instead."

## 2017-11-30 ENCOUNTER — Other Ambulatory Visit: Payer: Self-pay | Admitting: Physician Assistant

## 2017-12-27 DIAGNOSIS — M47816 Spondylosis without myelopathy or radiculopathy, lumbar region: Secondary | ICD-10-CM | POA: Diagnosis not present

## 2018-01-07 NOTE — Progress Notes (Deleted)
[facility]  PATIENT: Dale Conley DOB: 12-26-1975   REASON FOR VISIT: Follow-up for newly diagnosed obstructive sleep apnea with initial CPAP HISTORY FROM:    HISTORY OF PRESENT ILLNESS: Dale Conley is a 42 y.o. male , seen here in a referral from Dr. Oneta Rack for an evaluation for possible sleep apnea.  Dale Conley is a 42 year old Caucasian, right-handed gentleman who presents with a limited list of diagnoses, he has been taking as needed tramadol for pain, he is on vitamin D supplements, Wellbutrin only had a 50 mg in the morning, and an albuterol inhaler as needed for wheezing and shortness of breath.  But this medication was only prescribed during viral infection and is not a ongoing prescription.  He also takes Vyarin, a medication that is supposed to help him focus.   Chief complaint according to patient :  Sleep habits are as follows: Patient usually goes to bed between 10 PM and midnight this varies by his degree of sleepiness.  He usually watches TV before he goes to bed.  He also watches TV in bed.  He put his TV on a timer at the bedroom is cool, quiet and dark.  Patient feels that the TV is necessary for him fall asleep, prevents the TV from running throughout the night. He falls usually asleep in a supine position, 2 pillows for head support.  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     REVIEW OF SYSTEMS: Full 14 system review of systems performed and notable only for those listed, all others are neg:  Constitutional: neg  Cardiovascular: neg Ear/Nose/Throat: neg  Skin: neg Eyes: neg Respiratory: neg Gastroitestinal: neg  Hematology/Lymphatic: neg  Endocrine: neg Musculoskeletal:neg Allergy/Immunology: neg Neurological: neg Psychiatric: neg Sleep : neg   ALLERGIES: Allergies  Allergen Reactions  . Flexeril [Cyclobenzaprine]      HOME MEDICATIONS: Outpatient Medications Prior to Visit  Medication Sig Dispense Refill  . Phosphatidylserine-DHA-EPA (VAYARIN) 75-21.5-8.5 MG CAPS 2 pills a day 60 capsule 3  . traMADol (ULTRAM) 50 MG tablet TAKE 1 TABLET BY MOUTH TWICE DAILY AS NEEDED 60 tablet 0  . Vitamin D, Ergocalciferol, (DRISDOL) 50000 units CAPS capsule TAKE 1 CAPSULE BY MOUTH 2 DAYS PER WEEK 30 capsule 0   No facility-administered medications prior to visit.     PAST MEDICAL HISTORY: Past Medical History:  Diagnosis Date  . Hypertension     PAST SURGICAL HISTORY: No past surgical history on file.  FAMILY HISTORY: Family History  Problem Relation Age of Onset  . Heart disease Father 66       CABG  . Hypertension Father   . Hyperlipidemia Father   . Diabetes Paternal Grandfather     SOCIAL HISTORY: Social History   Socioeconomic History  . Marital status: Single    Spouse name: Not on file  . Number of children: Not on file  . Years of education: Not on file  . Highest education level: Not on file  Occupational History  . Not on file  Social Needs  . Financial resource strain: Not on file  . Food insecurity:    Worry: Not on file    Inability: Not on file  . Transportation needs:    Medical: Not on file    Non-medical: Not on file  Tobacco Use  . Smoking status: Never Smoker  .  Smokeless tobacco: Never Used  Substance and Sexual Activity  . Alcohol use: Yes    Alcohol/week: 8.4 oz    Types: 14 Standard drinks or equivalent per week    Comment: beer  . Drug use: No  . Sexual activity: Yes  Lifestyle  . Physical activity:    Days per week: Not on file    Minutes per session: Not on file  . Stress: Not on file  Relationships  . Social connections:    Talks on phone: Not on file    Gets together: Not on file    Attends religious service: Not on file    Active member of club or organization: Not on file    Attends meetings of clubs or organizations: Not on file     Relationship status: Not on file  . Intimate partner violence:    Fear of current or ex partner: Not on file    Emotionally abused: Not on file    Physically abused: Not on file    Forced sexual activity: Not on file  Other Topics Concern  . Not on file  Social History Narrative  . Not on file     PHYSICAL EXAM  There were no vitals filed for this visit. There is no height or weight on file to calculate BMI.  Generalized: Well developed, in no acute distress  Head: normocephalic and atraumatic,. Oropharynx benign  Neck: Supple, no carotid bruits  Cardiac: Regular rate rhythm, no murmur  Musculoskeletal: No deformity   Neurological examination   Mentation: Alert oriented to time, place, history taking. Attention span and concentration appropriate. Recent and remote memory intact.  Follows all commands speech and language fluent.   Cranial nerve II-XII: Fundoscopic exam reveals sharp disc margins.Pupils were equal round reactive to light extraocular movements were full, visual field were full on confrontational test. Facial sensation and strength were normal. hearing was intact to finger rubbing bilaterally. Uvula tongue midline. head turning and shoulder shrug were normal and symmetric.Tongue protrusion into cheek strength was normal. Motor: normal bulk and tone, full strength in the BUE, BLE, fine finger movements normal, no pronator drift. No focal weakness Sensory: normal and symmetric to light touch, pinprick, and  Vibration, proprioception  Coordination: finger-nose-finger, heel-to-shin bilaterally, no dysmetria Reflexes: Brachioradialis 2/2, biceps 2/2, triceps 2/2, patellar 2/2, Achilles 2/2, plantar responses were flexor bilaterally. Gait and Station: Rising up from seated position without assistance, normal stance,  moderate stride, good arm swing, smooth turning, able to perform tiptoe, and heel walking without difficulty. Tandem gait is steady  DIAGNOSTIC DATA (LABS,  IMAGING, TESTING) - I reviewed patient records, labs, notes, testing and imaging myself where available.  Lab Results  Component Value Date   WBC 6.4 09/09/2017   HGB 14.9 09/09/2017   HCT 42.2 09/09/2017   MCV 91.7 09/09/2017   PLT 277 09/09/2017      Component Value Date/Time   NA 138 09/09/2017 1638   K 4.3 09/09/2017 1638   CL 103 09/09/2017 1638   CO2 27 09/09/2017 1638   GLUCOSE 87 09/09/2017 1638   BUN 18 09/09/2017 1638   CREATININE 1.08 09/09/2017 1638   CALCIUM 9.8 09/09/2017 1638   PROT 7.3 09/09/2017 1638   ALBUMIN 4.3 05/04/2016 1438   AST 19 09/09/2017 1638   ALT 21 09/09/2017 1638   ALKPHOS 55 05/04/2016 1438   BILITOT 0.4 09/09/2017 1638   GFRNONAA 85 09/09/2017 1638   GFRAA 98 09/09/2017 1638   Lab Results  Component Value Date   CHOL 253 (H) 09/09/2017   HDL 73 09/09/2017   LDLCALC 154 (H) 09/09/2017   TRIG 139 09/09/2017   CHOLHDL 3.5 09/09/2017   Lab Results  Component Value Date   HGBA1C 5.5 05/01/2015   Lab Results  Component Value Date   VITAMINB12 607 05/01/2015   Lab Results  Component Value Date   TSH 1.83 09/09/2017    ***  ASSESSMENT AND PLAN  42 y.o. year old male  has a past medical history of Hypertension. here with ***    Cline CrockNancy Carolyn Deliyah Muckle, GNP, [facility], APRN  [facility] [address], [address] [place], [place] [address] [phone]

## 2018-01-10 ENCOUNTER — Ambulatory Visit: Payer: Self-pay | Admitting: Nurse Practitioner

## 2018-01-10 ENCOUNTER — Telehealth: Payer: Self-pay | Admitting: *Deleted

## 2018-01-10 NOTE — Telephone Encounter (Signed)
Pt no showed appt fot today.

## 2018-01-12 ENCOUNTER — Encounter: Payer: Self-pay | Admitting: Nurse Practitioner

## 2018-01-12 DIAGNOSIS — M47816 Spondylosis without myelopathy or radiculopathy, lumbar region: Secondary | ICD-10-CM | POA: Diagnosis not present

## 2018-02-10 DIAGNOSIS — M47816 Spondylosis without myelopathy or radiculopathy, lumbar region: Secondary | ICD-10-CM | POA: Diagnosis not present

## 2018-05-05 ENCOUNTER — Other Ambulatory Visit: Payer: Self-pay

## 2018-05-12 MED ORDER — METHOCARBAMOL 500 MG PO TABS: 500.0000 mg | ORAL_TABLET | Freq: Two times a day (BID) | ORAL | 2 refills | Status: DC | PRN

## 2018-05-12 NOTE — Addendum Note (Signed)
Addended by: Quentin MullingOLLIER, Renetta Suman R on: 05/12/2018 01:04 PM   Modules accepted: Orders

## 2018-05-19 ENCOUNTER — Encounter: Payer: Self-pay | Admitting: Physician Assistant

## 2018-09-12 ENCOUNTER — Telehealth: Payer: Self-pay | Admitting: Neurology

## 2018-09-12 NOTE — Telephone Encounter (Signed)
Pt seen Dr. Vickey Hugerohmeier on 09/16/18. Pt had an hst on 10/13/17. Pt decided to go with a dental device. Pt is now interested in getting set up on a cpap machine. I told the pt someone would call him back letting him know what the next step would be.

## 2018-09-12 NOTE — Telephone Encounter (Signed)
Called the patient to make him aware that since everything has been with in the year I believe he can call [facility] and just get set up. Advised the patient that insurance requires that he is set up within 31-90 days after using the machine so he would need to contact us once he is established to set up an apt 31-90 days after starting. Gave the patient [facility]'s number and he will call back to schedule once set up.

## 2018-09-21 NOTE — Progress Notes (Signed)
Complete Physical  Assessment and Plan:  Hypogonadism in male Hypogonadism-  check testosterone levels as needed.   Bilateral low back pain without sciatica - no red flags, declines PT  Vitamin D deficiency -     VITAMIN D 25 Hydroxy (Vit-D Deficiency, Fractures)  Essential hypertension -     CBC with Differential/Platelet -     BASIC METABOLIC PANEL WITH GFR -     Hepatic function panel -     TSH -     Urinalysis, Routine w reflex microscopic (not at [facility]) -     Microalbumin / creatinine urine ratio -     EKG 12-Lead  Routine general medical examination at a health care facility 1 year  Medication management -     Magnesium  BMI 29.0-29.9,adult  Sleep apnea, unspecified type Get consistant  Screening cholesterol level -     Lipid panel  Discussed med's effects and SE's. Screening labs and tests as requested with regular follow-up as recommended. Over 40 minutes of exam, counseling, chart review and critical decision making was performed Future Appointments  Date Time Provider Department Center  03/23/2019  3:30 PM Judd Gaudierorbett, Ashley, NP [facility] None  09/25/2019  3:00 PM Quentin Mullingollier, , PA-C [facility] None     HPI  This very nice 42 y.o.male presents for complete physical.  Patient has no major health issues.    He had a sleep study and recommend CPAP from [facility]. He is on the nose piece, could not tolerate it. States last several nights not able to wear it due to nasal congestion. Could not tolerate mouth piece.   He does not workout anymore due to heat and new baby. He has a history of elevated BP, worse when he comes to the doctors, has checked at home and it is okay, BP today is BP: 132/84  Finally, patient has history of Vitamin D Deficiency and last vitamin D was  Currently on supplementation.  Lab Results  Component Value Date   VD25OH 24 (L) 09/09/2017   Lab Results  Component Value Date   CHOL 253 (H) 09/09/2017   HDL 73 09/09/2017   LDLCALC  154 (H) 09/09/2017   TRIG 139 09/09/2017   CHOLHDL 3.5 09/09/2017   Married with new baby girl, she is 4 Year and 1.7 months old, [place]. Wife is Fish farm managermobile vet.    Works for [employer], travels a lot, is in the car a lot. Just transferred to [employer], doing ME and will graduate this year.   He does not take vayarin regularly, gets from pharmacy, takes as needed.  He has a history of testosterone deficiency, it not on anything, states it makes him irritable.  Lab Results  Component Value Date   TESTOSTERONE 214 (L) 05/04/2016   Occ lower back pain, has seen chiropractor and has done at home stretches, will having back issues, will have occ bilateral tingling lateral legs, very rare, will only go down to mid hip. Ibuprofen, tylenol can help.  Takes tramadol. occ for back, last refill was 11/2017 BMI is Body mass index is 29.52 kg/m., he is working on diet and exercise. Wt Readings from Last 3 Encounters:  09/22/18 202 lb 12.8 oz (92 kg)  09/16/17 196 lb 8 oz (89.1 kg)  09/09/17 197 lb 12.8 oz (89.7 kg)    Current Medications:  Current Outpatient Medications on File Prior to Visit  Medication Sig Dispense Refill  . methocarbamol (ROBAXIN) 500 MG tablet Take 1 tablet (500 mg  total) by mouth 2 (two) times daily as needed for muscle spasms. 60 tablet 2  . Phosphatidylserine-DHA-EPA (VAYARIN) 75-21.5-8.5 MG CAPS 2 pills a day 60 capsule 3  . traMADol (ULTRAM) 50 MG tablet TAKE 1 TABLET BY MOUTH TWICE DAILY AS NEEDED 60 tablet 0  . Vitamin D, Ergocalciferol, (DRISDOL) 50000 units CAPS capsule TAKE 1 CAPSULE BY MOUTH 2 DAYS PER WEEK 30 capsule 0   No current facility-administered medications on file prior to visit.    Health Maintenance:    There is no immunization history on file for this patient.  TD/TDAP: 2015 Influenza: 2015 Pneumovax: N/A Prevnar 13: N/A  Sexually Active: yes, wife  Last Dental Exam: Dr. Ninetta Lights, q 6 months Last Eye Exam: None  Medical History:   Past Medical History:  Diagnosis Date  . Hypertension    Allergies Allergies  Allergen Reactions  . Flexeril [Cyclobenzaprine]     SURGICAL HISTORY He  has no past surgical history on file.   FAMILY HISTORY His family history includes Diabetes in his paternal grandfather; Heart disease (age of onset: 18) in his father; Hyperlipidemia in his father; Hypertension in his father.   SOCIAL HISTORY He  reports that he has never smoked. He has never used smokeless tobacco. He reports current alcohol use of about 14.0 standard drinks of alcohol per week. He reports that he does not use drugs.  Review of Systems: Review of Systems  Constitutional: Positive for malaise/fatigue. Negative for chills, diaphoresis, fever and weight loss.  HENT: Negative for congestion, ear discharge, ear pain, hearing loss, nosebleeds, sore throat and tinnitus.   Eyes: Negative.   Respiratory: Negative.  Negative for stridor.   Cardiovascular: Negative.   Gastrointestinal: Positive for heartburn. Negative for abdominal pain, blood in stool, constipation, diarrhea, melena, nausea and vomiting.  Genitourinary: Negative.   Musculoskeletal: Positive for back pain. Negative for falls, joint pain, myalgias and neck pain.  Skin: Negative.   Neurological: Negative for dizziness, tingling, tremors, sensory change, speech change, focal weakness, seizures, loss of consciousness, weakness and headaches (has TMJ, needs mouth guard).  Endo/Heme/Allergies: Negative.   Psychiatric/Behavioral: Negative for depression, hallucinations, memory loss, substance abuse and suicidal ideas. The patient has insomnia. The patient is not nervous/anxious.    Physical Exam: Estimated body mass index is 29.52 kg/m as calculated from the following:   Height as of this encounter: 5' 9.5" (1.765 m).   Weight as of this encounter: 202 lb 12.8 oz (92 kg). BP 132/84   Pulse 100   Temp 98.9 F (37.2 C)   Ht 5' 9.5" (1.765 m)   Wt 202 lb  12.8 oz (92 kg)   SpO2 98%   BMI 29.52 kg/m  General Appearance: Well nourished, in no apparent distress.  Eyes: PERRLA, EOMs, conjunctiva no swelling or erythema, normal fundi and vessels.  Sinuses: No Frontal/maxillary tenderness  ENT/Mouth: Ext aud canals clear, normal light reflex with TMs without erythema, bulging. Good dentition. No erythema, swelling, or exudate on post pharynx. Tonsils not swollen or erythematous. Hearing normal.  Neck: Supple, thyroid normal. No bruits  Respiratory: Respiratory effort normal, BS equal bilaterally without rales, rhonchi, wheezing or stridor.  Cardio: RRR without murmurs, rubs or gallops. Brisk peripheral pulses without edema.  Chest: symmetric, with normal excursions and percussion.  Abdomen: Soft, nontender, no guarding, rebound, hernias, masses, or organomegaly.  Lymphatics: Non tender without lymphadenopathy.  Genitourinary: defer Musculoskeletal: Full ROM all peripheral extremities,5/5 strength, and normal gait. Negative straight leg.   Skin: Warm,  dry without rashes, lesions, ecchymosis. Neuro: Cranial nerves intact, reflexes equal bilaterally. Normal muscle tone, no cerebellar symptoms. Sensation intact.  Psych: Awake and oriented X 3, normal affect, Insight and Judgment appropriate.   EKG: WNL, IRBBB  Quentin MullingAmanda  3:17 PM [facility]

## 2018-09-22 ENCOUNTER — Ambulatory Visit: Payer: BLUE CROSS/BLUE SHIELD | Admitting: Physician Assistant

## 2018-09-22 ENCOUNTER — Encounter: Payer: Self-pay | Admitting: Physician Assistant

## 2018-09-22 VITALS — BP 132/84 | HR 100 | Temp 98.9°F | Ht 69.5 in | Wt 202.8 lb

## 2018-09-22 DIAGNOSIS — Z0000 Encounter for general adult medical examination without abnormal findings: Secondary | ICD-10-CM

## 2018-09-22 DIAGNOSIS — Z79899 Other long term (current) drug therapy: Secondary | ICD-10-CM

## 2018-09-22 DIAGNOSIS — M545 Low back pain, unspecified: Secondary | ICD-10-CM

## 2018-09-22 DIAGNOSIS — R519 Headache, unspecified: Secondary | ICD-10-CM | POA: Insufficient documentation

## 2018-09-22 DIAGNOSIS — Z136 Encounter for screening for cardiovascular disorders: Secondary | ICD-10-CM

## 2018-09-22 DIAGNOSIS — E559 Vitamin D deficiency, unspecified: Secondary | ICD-10-CM

## 2018-09-22 DIAGNOSIS — Z1389 Encounter for screening for other disorder: Secondary | ICD-10-CM | POA: Diagnosis not present

## 2018-09-22 DIAGNOSIS — I10 Essential (primary) hypertension: Secondary | ICD-10-CM | POA: Diagnosis not present

## 2018-09-22 DIAGNOSIS — Z0001 Encounter for general adult medical examination with abnormal findings: Secondary | ICD-10-CM

## 2018-09-22 DIAGNOSIS — Z1329 Encounter for screening for other suspected endocrine disorder: Secondary | ICD-10-CM | POA: Diagnosis not present

## 2018-09-22 DIAGNOSIS — E291 Testicular hypofunction: Secondary | ICD-10-CM | POA: Diagnosis not present

## 2018-09-22 DIAGNOSIS — G4733 Obstructive sleep apnea (adult) (pediatric): Secondary | ICD-10-CM

## 2018-09-22 DIAGNOSIS — Z13220 Encounter for screening for lipoid disorders: Secondary | ICD-10-CM | POA: Diagnosis not present

## 2018-09-22 DIAGNOSIS — R51 Headache: Secondary | ICD-10-CM

## 2018-09-22 DIAGNOSIS — G8929 Other chronic pain: Secondary | ICD-10-CM

## 2018-09-22 NOTE — Patient Instructions (Addendum)
Can do a steroid nasal spary 1-2 sparys at night each nostril. Remember to spray each nostril twice towards the outer part of your eye.  Do not sniff but instead pinch your nose and tilt your head back to help the medicine get into your sinuses.  The best time to do this is at bedtime. Stop if you get blurred vision or nose bleeds.   Your Vitamin D is not in range, goal is between 70-100. Please make sure that you are taking your Vitamin D as directed. It is very important as a natural antiinflammatory helping hair, skin, and nails, as well as reducing stroke and heart attack risk. It helps your bones and helps with mood. It also decreases numerous cancer risks so please take it as directed.      Tarsal Tunnel Syndrome  Tarsal tunnel syndrome is a condition that happens when a nerve (tibial nerve) is irritated or squeezed (compressed) as it passes through an area on the inside of your ankle (tarsal tunnel). The tarsal tunnel is a narrow passage through the connective tissue and bones in your feet (tarsals). The tibial nerve passes behind the large bony bump at your inner ankle (medial malleolus) and sends branches to your foot and toes. This nerve helps supply feeling to your heel, to the bottom of your foot, and to some of your toe muscles. Tarsal tunnel syndrome usually causes ankle and foot pain that gets worse with activity. What are the causes? Tarsal tunnel syndrome can be caused by any condition that narrows the space in the tarsal tunnel. Athletes may get tarsal tunnel syndrome from a fractured ankle or from an outward (eversion) ankle sprain that results in scarring or swelling. Other common causes include:  Over-pronation. This is when your feet roll in and flatten too much when you stand, walk, or run.  Extra pressure on the tarsal tunnel area from tight or stiff shoes or boots.  Decreased room in the tarsal tunnel due to small, fluid-filled sacs (cysts) or growths on the bones near the  tunnel (exostosis). What increases the risk? This condition is more likely to develop in people who:  Play sports where they wear high, stiff boots, such as downhill skiing.  Play sports with repetitive motion, such as running.  Play sports on uneven surfaces that can lead to a sprained ankle, such as soccer or football.  Have had an inner ankle injury.  Have flat feet.  Have a condition such as diabetes, hypothyroidism, or rheumatoid arthritis. What are the signs or symptoms? Symptoms of this condition include:  Burning pain behind the ankle, in the heel, or in the foot that gets worse if you are standing, walking, or running.  Numbness or a tingling sensation (pins and needles) in your heel, foot, or toes. At first, your symptoms may get worse with activity and be relieved with rest. Over time, your symptoms may become constant or come on sooner with less activity. How is this diagnosed? This condition is diagnosed based on your symptoms, medical history, and physical exam. During the exam, your health care provider may tap on the area below your ankle to check for tingling in your foot or toes. Your health care provider may also inject numbing medicine into the tarsal tunnel to see if it relieves your pain. You may also have other tests, including:  X-rays to check bone structure.  MRI or ultrasound to examine nerve and tendon structures and find where your nerve is getting compressed.  An electrical study of nerve function (electromyography, or EMG). How is this treated? Treatment may include:  Wearing a removable splint or boot for ankle support.  Using a shoe insert (orthotic) to help support your arch.  Using ice to reduce swelling.  Taking pain medicine.  Having medicine injected into your ankle joint to reduce pain and swelling.  Starting range-of-motion exercises and strengthening exercises.  Gradually returning to full activity. The timing will depend on the  severity of your condition and your response to treatment. You may need surgery if there is a soft tissue growth or a bone growth, or if other treatments have not helped. After surgery, you may need to wear a removable splint or boot for support. You also may need physical therapy. Follow these instructions at home: If you have a splint or boot:  Wear the splint or boot as told by your health care provider. Remove it only as told by your health care provider.  Loosen the splint or boot if your toes tingle, become numb, or turn cold and blue.  If your splint or boot is not waterproof: ? Do not let it get wet. ? Cover it with a watertight covering when you take a bath or shower.  Keep the splint or boot clean. Managing pain, stiffness, and swelling  If directed, put ice on the injured area. ? Put ice in a plastic bag. ? Place a towel between your skin and the bag. ? Leave the ice on for 20 minutes, 2-3 times a day.  Move your toes often to avoid stiffness and to lessen swelling.  Raise (elevate) your foot above the level of your heart while you are sitting or lying down. Driving  Ask your health care provider when it is safe to drive if you have a splint or boot on a foot that you use for driving.  Do not drive or operate heavy machinery while taking prescription pain medicine. Activity  Return to your normal activities as told by your health care provider. Ask your health care provider what activities are safe for you.  Do exercises as told by your health care provider. General instructions  Do not use any tobacco products, such as cigarettes, chewing tobacco, and e-cigarettes. Tobacco can delay healing. If you need help quitting, ask your health care provider.  Take over-the-counter and prescription medicines only as told by your health care provider.  Keep all follow-up visits as told by your health care provider. This is important. How is this prevented?  Give your body  time to rest between periods of activity.  Make sure to wear supportive and comfortable shoes during athletic activity.  Do not over-tighten ski boots or the laces on high-top shoes.  Be safe and responsible while being active to avoid falls. Contact a health care provider if:  Your ankle pain is not getting better.  You are unable to support (bear) body weight on your foot without pain. This information is not intended to replace advice given to you by your health care provider. Make sure you discuss any questions you have with your health care provider. Document Released: 09/14/2005 Document Revised: 05/20/2016 Document Reviewed: 08/27/2015 Elsevier Interactive Patient Education  [employer]2019 Elsevier Inc.    When it comes to diets, agreement about the perfect plan isn't easy to find, even among the experts. Experts at the [employer] developed an idea known as the Healthy Eating Plate. Just imagine a plate divided into logical, healthy portions.  The emphasis is on diet quality:  Load up on vegetables and fruits - one-half of your plate: Aim for color and variety, and remember that potatoes don't count.  Go for whole grains - one-quarter of your plate: Whole wheat, barley, wheat berries, quinoa, oats, brown rice, and foods made with them. If you want pasta, go with whole wheat pasta.  Protein power - one-quarter of your plate: Fish, chicken, beans, and nuts are all healthy, versatile protein sources. Limit red meat.  The diet, however, does go beyond the plate, offering a few other suggestions.  Use healthy plant oils, such as olive, canola, soy, corn, sunflower and peanut. Check the labels, and avoid partially hydrogenated oil, which have unhealthy trans fats.  If you're thirsty, drink water. Coffee and tea are good in moderation, but skip sugary drinks and limit milk and dairy products to one or two daily servings.  The type of carbohydrate in the diet is more  important than the amount. Some sources of carbohydrates, such as vegetables, fruits, whole grains, and beans-are healthier than others.  Finally, stay active.

## 2018-09-23 LAB — CBC WITH DIFFERENTIAL/PLATELET
ABSOLUTE MONOCYTES: 697 {cells}/uL (ref 200–950)
BASOS ABS: 58 {cells}/uL (ref 0–200)
Basophils Relative: 0.7 %
Eosinophils Absolute: 257 cells/uL (ref 15–500)
Eosinophils Relative: 3.1 %
HEMATOCRIT: 44 % (ref 38.5–50.0)
HEMOGLOBIN: 15.3 g/dL (ref 13.2–17.1)
LYMPHS ABS: 3577 {cells}/uL (ref 850–3900)
MCH: 31.9 pg (ref 27.0–33.0)
MCHC: 34.8 g/dL (ref 32.0–36.0)
MCV: 91.7 fL (ref 80.0–100.0)
MPV: 9.1 fL (ref 7.5–12.5)
Monocytes Relative: 8.4 %
NEUTROS ABS: 3710 {cells}/uL (ref 1500–7800)
Neutrophils Relative %: 44.7 %
Platelets: 275 10*3/uL (ref 140–400)
RBC: 4.8 10*6/uL (ref 4.20–5.80)
RDW: 12.2 % (ref 11.0–15.0)
Total Lymphocyte: 43.1 %
WBC: 8.3 10*3/uL (ref 3.8–10.8)

## 2018-09-23 LAB — LIPID PANEL
Cholesterol: 255 mg/dL — ABNORMAL HIGH (ref <200)
HDL: 65 mg/dL (ref >40)
LDL CHOLESTEROL (CALC): 147 mg/dL — AB
NON-HDL CHOLESTEROL (CALC): 190 mg/dL — AB (ref <130)
Total CHOL/HDL Ratio: 3.9 (calc) (ref <5.0)
Triglycerides: 264 mg/dL — ABNORMAL HIGH (ref <150)

## 2018-09-23 LAB — COMPLETE METABOLIC PANEL WITH GFR
AG Ratio: 1.6 (calc) (ref 1.0–2.5)
ALBUMIN MSPROF: 4.4 g/dL (ref 3.6–5.1)
ALKALINE PHOSPHATASE (APISO): 66 U/L (ref 40–115)
ALT: 24 U/L (ref 9–46)
AST: 24 U/L (ref 10–40)
BILIRUBIN TOTAL: 0.3 mg/dL (ref 0.2–1.2)
BUN: 20 mg/dL (ref 7–25)
CHLORIDE: 102 mmol/L (ref 98–110)
CO2: 27 mmol/L (ref 20–32)
Calcium: 9.8 mg/dL (ref 8.6–10.3)
Creat: 0.84 mg/dL (ref 0.60–1.35)
GFR, EST AFRICAN AMERICAN: 125 mL/min/{1.73_m2} (ref >=60)
GFR, Est Non African American: 108 mL/min/{1.73_m2} (ref >=60)
GLOBULIN: 2.8 g/dL (ref 1.9–3.7)
Glucose, Bld: 75 mg/dL (ref 65–99)
Potassium: 3.9 mmol/L (ref 3.5–5.3)
Sodium: 136 mmol/L (ref 135–146)
Total Protein: 7.2 g/dL (ref 6.1–8.1)

## 2018-09-23 LAB — URINALYSIS, ROUTINE W REFLEX MICROSCOPIC
Bilirubin Urine: NEGATIVE
GLUCOSE, UA: NEGATIVE
HGB URINE DIPSTICK: NEGATIVE
KETONES UR: NEGATIVE
Leukocytes, UA: NEGATIVE
Nitrite: NEGATIVE
PROTEIN: NEGATIVE
Specific Gravity, Urine: 1.017 (ref 1.001–1.03)
pH: 5.5 (ref 5.0–8.0)

## 2018-09-23 LAB — MICROALBUMIN / CREATININE URINE RATIO
CREATININE, URINE: 70 mg/dL (ref 20–320)
Microalb Creat Ratio: 6 mcg/mg creat (ref <30)
Microalb, Ur: 0.4 mg/dL

## 2018-09-23 LAB — TESTOSTERONE: Testosterone: 238 ng/dL — ABNORMAL LOW (ref 250–827)

## 2018-09-23 LAB — VITAMIN D 25 HYDROXY (VIT D DEFICIENCY, FRACTURES): VIT D 25 HYDROXY: 19 ng/mL — AB (ref 30–100)

## 2018-09-23 LAB — TSH: TSH: 0.77 m[IU]/L (ref 0.40–4.50)

## 2018-09-23 LAB — MAGNESIUM: Magnesium: 1.8 mg/dL (ref 1.5–2.5)

## 2018-09-25 DIAGNOSIS — G4733 Obstructive sleep apnea (adult) (pediatric): Secondary | ICD-10-CM | POA: Diagnosis not present

## 2018-09-26 ENCOUNTER — Other Ambulatory Visit: Payer: Self-pay | Admitting: Physician Assistant

## 2018-10-05 ENCOUNTER — Encounter: Payer: Self-pay | Admitting: Physician Assistant

## 2018-10-05 ENCOUNTER — Ambulatory Visit: Payer: BLUE CROSS/BLUE SHIELD | Admitting: Physician Assistant

## 2018-10-05 VITALS — BP 134/78 | HR 80 | Temp 97.3°F | Ht 69.5 in | Wt 204.4 lb

## 2018-10-05 DIAGNOSIS — M5441 Lumbago with sciatica, right side: Secondary | ICD-10-CM

## 2018-10-05 DIAGNOSIS — G8929 Other chronic pain: Secondary | ICD-10-CM | POA: Diagnosis not present

## 2018-10-05 MED ORDER — TRAMADOL HCL 50 MG PO TABS: 50.0000 mg | ORAL_TABLET | Freq: Three times a day (TID) | ORAL | 0 refills | Status: DC | PRN

## 2018-10-05 MED ORDER — PREDNISONE 20 MG PO TABS: ORAL_TABLET | ORAL | 0 refills | Status: DC

## 2018-10-05 NOTE — Progress Notes (Signed)
   Subjective:    Patient ID: Dale Conley, male    DOB: [birth year], 43 y.o.   MRN: [id number]  HPI 43 y.o. WM presents with lower back pain. No injury. Started 1 oclock in afternoon Friday. He states right lower back, has tried methocarbamol and ibuprofen are not helping it. States it was 10/10 and now an 8/10. Worse with movement. Having shooting pain lateral right leg to his knee with some numbness tingling, did not go past his knee and this has resolved. . No weakness, numbness, tingling in his leg, no urinary issues.    Follows with Dr. Rosanne Gutting and Dr. 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   Blood pressure 134/78, pulse 80, temperature (!) 97.3 F (36.3 C), height 5' 9.5" (1.765 m), weight 204 lb 6.4 oz (92.7 kg), SpO2 97 %.  Medications Current Outpatient Medications on File Prior to Visit  Medication Sig  . methocarbamol (ROBAXIN) 500 MG tablet Take 1 tablet (500 mg total) by mouth 2 (two) times daily as needed for muscle spasms.  . Phosphatidylserine-DHA-EPA (VAYARIN) 75-21.5-8.5 MG CAPS 2 pills a day  . Vitamin D, Ergocalciferol, (DRISDOL) 1.25 MG (50000 UT) CAPS capsule TAKE 1 CAPSULE BY MOUTH 2 DAYS PER WEEK   No current facility-administered medications on file prior to visit.     Problem list He has Back pain; Hypogonadism in male; Essential hypertension; Vitamin D deficiency; Medication management; Obstructive sleep apnea syndrome; and Headache on their problem list.   Review of Systems See HPI    Objective:   Physical Exam  Antalgic gait, pain in all spheres, Good reflexes bilateral, + tenderness right lower back, decreased sensation right lateral calf in L5 distribution + straight leg on right worse with dorsiflexion.      Assessment & Plan:  Lower back pain - positive straight leg unilateral worse with dorsiflexion, good reflexes, possible decreased sensation L5 May need to repeat MRI pending response to  prednisone Prednisone was prescribed, RICE, and exercise given 5 days of tramadol was given Follow up with ortho  Go to the ER if you have any new weakness in your legs, have trouble controlling your urine or bowels, or have worsening pain.   Roderic Ovens [place] Controlled Substance Reporting System was reviewed prior to prescribing the acute pain medication, no longer than 5 days was prescribed.  . I have reviewed adverse effects to taking opioids, to include constipation, dizziness, drowsiness, confusion. . I have explained that opiods are safe for short term use for acute pain but are not for chronic pain, if the pain continues the patient needs a reevaluation here in the office or if the pain worsens go to the ER. Marland Kitchen Opioid tolerance, dependence and addiction was discussed with the patient.   . Patient understands and agrees to remain compliant with the treatment plan.

## 2018-10-05 NOTE — Patient Instructions (Signed)
Go to the ER if you have any new weakness in your legs, have trouble controlling your urine or bowels, or have worsening pain.    Please take the prednisone to help decrease inflammation and therefore decrease symptoms. Take it it with food to avoid GI upset. It can cause increased energy but on the other hand it can make it hard to sleep at night so please take it AT NIGHT WITH DINNER, it takes 8-12 hours to start working so it will NOT affect your sleeping if you take it at night with your food!!  If you are diabetic it will increase your sugars so decrease carbs and monitor your sugars closely.     Chronic Back Pain When back pain lasts longer than 3 months, it is called chronic back pain.The cause of your back pain may not be known. Some common causes include:  Wear and tear (degenerative disease) of the bones, ligaments, or disks in your back.  Inflammation and stiffness in your back (arthritis). People who have chronic back pain often go through certain periods in which the pain is more intense (flare-ups). Many people can learn to manage the pain with home care. Follow these instructions at home: Pay attention to any changes in your symptoms. Take these actions to help with your pain: Activity   Avoid bending and other activities that make the problem worse.  Maintain a proper position when standing or sitting: ? When standing, keep your upper back and neck straight, with your shoulders pulled back. Avoid slouching. ? When sitting, keep your back straight and relax your shoulders. Do not round your shoulders or pull them backward.  Do not sit or stand in one place for long periods of time.  Take brief periods of rest throughout the day. This will reduce your pain. Resting in a lying or standing position is usually better than sitting to rest.  When you are resting for longer periods, mix in some mild activity or stretching between periods of rest. This will help to prevent  stiffness and pain.  Get regular exercise. Ask your health care provider what activities are safe for you.  Do not lift anything that is heavier than 10 lb (4.5 kg). Always use proper lifting technique, which includes: ? Bending your knees. ? Keeping the load close to your body. ? Avoiding twisting.  Sleep on a firm mattress in a comfortable position. Try lying on your side with your knees slightly bent. If you lie on your back, put a pillow under your knees. Managing pain  If directed, apply ice to the painful area. Your health care provider may recommend applying ice during the first 24-48 hours after a flare-up begins. ? Put ice in a plastic bag. ? Place a towel between your skin and the bag. ? Leave the ice on for 20 minutes, 2-3 times per day.  If directed, apply heat to the affected area as often as told by your health care provider. Use the heat source that your health care provider recommends, such as a moist heat pack or a heating pad. ? Place a towel between your skin and the heat source. ? Leave the heat on for 20-30 minutes. ? Remove the heat if your skin turns bright red. This is especially important if you are unable to feel pain, heat, or cold. You may have a greater risk of getting burned.  Try soaking in a warm tub.  Take over-the-counter and prescription medicines only as told by your  health care provider.  Keep all follow-up visits as told by your health care provider. This is important. Contact a health care provider if:  You have pain that is not relieved with rest or medicine. Get help right away if:  You have weakness or numbness in one or both of your legs or feet.  You have trouble controlling your bladder or your bowels.  You have nausea or vomiting.  You have pain in your abdomen.  You have shortness of breath or you faint. This information is not intended to replace advice given to you by your health care provider. Make sure you discuss any  questions you have with your health care provider. Document Released: 10/22/2004 Document Revised: 04/21/2018 Document Reviewed: 03/24/2017 Elsevier Interactive Patient Education  2019 [employer]

## 2018-10-26 DIAGNOSIS — G4733 Obstructive sleep apnea (adult) (pediatric): Secondary | ICD-10-CM | POA: Diagnosis not present

## 2018-11-15 ENCOUNTER — Encounter: Payer: Self-pay | Admitting: Adult Health Nurse Practitioner

## 2018-11-15 ENCOUNTER — Ambulatory Visit (INDEPENDENT_AMBULATORY_CARE_PROVIDER_SITE_OTHER): Payer: BLUE CROSS/BLUE SHIELD | Admitting: Adult Health Nurse Practitioner

## 2018-11-15 VITALS — BP 120/84 | HR 98 | Temp 97.5°F | Ht 69.5 in | Wt 202.0 lb

## 2018-11-15 DIAGNOSIS — J029 Acute pharyngitis, unspecified: Secondary | ICD-10-CM

## 2018-11-15 DIAGNOSIS — E559 Vitamin D deficiency, unspecified: Secondary | ICD-10-CM | POA: Diagnosis not present

## 2018-11-15 DIAGNOSIS — R0981 Nasal congestion: Secondary | ICD-10-CM | POA: Diagnosis not present

## 2018-11-15 DIAGNOSIS — R05 Cough: Secondary | ICD-10-CM

## 2018-11-15 DIAGNOSIS — R059 Cough, unspecified: Secondary | ICD-10-CM

## 2018-11-15 MED ORDER — AZITHROMYCIN 250 MG PO TABS: ORAL_TABLET | ORAL | 0 refills | Status: DC

## 2018-11-15 MED ORDER — VITAMIN D (ERGOCALCIFEROL) 1.25 MG (50000 UNIT) PO CAPS
ORAL_CAPSULE | ORAL | 1 refills | Status: DC
Start: 2018-11-15 — End: 2019-03-23

## 2018-11-15 NOTE — Patient Instructions (Addendum)
   Treat the symptoms!  Please take these medications:     Allergy Symptoms / Runny Nose: PICK ONE  Zyrtec / Cetirizine Take 10mg  by mouth May cause drowsiness, take nightly Be sure to drink plenty of water If this is not effective, try Xyzal or Allegra  OR  Xyzal / Levocetirazine  Take 5mg  by mouth May cause drowsiness, take nightly Be sure to drink plenty of water If this is not effective try Allegra or Zyrtec  OR  Allegra / fexofenadine Take 180mg  by mouth daily If this is not effective try Zyrtec or Xyzal      Cough:  Mucinex Take one tablet by mouth every 12 hours with plenty of water while you have symptoms. This is an expectorant and will help to clear the congestion in your lungs.    Chest Congestion:  Cool-mist humidifier / vaporizer Or warm steamy shower  Menthol Chest rubs You can get at any pharmacy Use as directed on packaging     Sinus Congestion:  Norel AD or Tylenol Cold and Sinus Take one tablet by mouth every 4-6 hours as needed This will help to dry out your sinuses, nasal decongestant and acetaminophen.  Flonase: One spray in each nostril daily  This will help to open your nasal passages   [facility] Sinus Rinse / Neti Pot Use warm bottled or distilled water DO NOT use tap water! Use twice a day as needed This will help to sooth irritated sinuses and clear nasal congestion If using nasal sprays, do so after completing this.  Fevers and or Pain:  Tylenol Extra Strength 500mg  Take 1-2 tablets every 8 hours as needed for fever or pain Do not take more than 4,000mg  to tylenol in 24 hours  May rotate between Tylenol and ibuprofen.  They are different.   Ibuprofen / Advil / Motrin - Will also help with inflammation  600mg  every six hours OR 800mg  every 8 hours If you have this at home each tablet is 200mg .    Antibiotic: Take as directed on your prescription   Sore Throat:  Cepacol lozenges as needed You can get  these at any pharmacy  Throat Spray: Use as directed on package as needed You may get this at any pharmacy  Warm / Cold drinks: This will help to sooth your throat  Gargle warm salt water: Gargle then spit water out, do not swallow     General Care:  Drink plenty of clear fluids  Get plenty of rest  Wash hands frequently and sanitize shared surfaces, kitchens, bathrooms.  Call or return with new or worsening symptoms

## 2018-11-15 NOTE — Progress Notes (Signed)
Assessment and Plan:  Dale Conley was seen today for acute visit.  Diagnoses and all orders for this visit:  Sore throat OTC lozenges or throat spray Gargle walt salt water, spit out Warm / cold liquids  You may take: buprofen / Advil / Motrin - for pain swelling of throat  600mg  every six hours OR 800mg  every 8 hours If you have this at home each tablet is 200mg .   AND / OR  Tylenol Extra Strength 500mg  - for pain Take 1-2 tablets every 8 hours as needed for fever or pain Do not take more than 4,000mg  to tylenol in 24 hours  General Care: Drink plenty of clear fluids Get plenty of rest Wash hands frequently and sanitize shared surfaces, kitchens, bathrooms.  Pharyngitis, unspecified etiology Going to treat based  -     azithromycin (ZITHROMAX) 250 MG tablet; Take 2 tablets PO on day 1, then 1 tablet PO Q24H x 4 days  Cough  Nasal sinus congestion  Vitamin D Defciency -     Vitamin D, Ergocalciferol, (DRISDOL) 1.25 MG (50000 UT) CAPS capsule; TAKE 1 CAPSULE BY MOUTH 2 DAYS PER WEEK       Further disposition pending results of labs. Discussed med's effects and SE's.   Over 30 minutes of exam, counseling, chart review, and critical decision making was performed.   Future Appointments  Date Time Provider Department Center  03/23/2019  3:30 PM Judd Gaudier, NP [facility] None  09/25/2019  3:00 PM Quentin Mulling, PA-C [facility] None    ------------------------------------------------------------------------------------------------------------------   HPI 42 y.o.male presents for nightsweats, chills but afebrile.  Sore thoat that is worse in the morning.  After drinking liquids it goes away.  He reports yesterday he had a hard time swallowing related to sore throat. Nothing makes this worse.  This has been going on for over one week.  Reports some ear fullness and popping. Also has a cough that is productive only in the morning.  Reports he has been coughing so hard  and frequently that he is sore around his ribs.  Reports that he has not been taking any OTC product for this except for cough drops to help his throat.  Past Medical History:  Diagnosis Date  . Hypertension      Allergies  Allergen Reactions  . Flexeril [Cyclobenzaprine]     Current Outpatient Medications on File Prior to Visit  Medication Sig  . methocarbamol (ROBAXIN) 500 MG tablet Take 1 tablet (500 mg total) by mouth 2 (two) times daily as needed for muscle spasms.   No current facility-administered medications on file prior to visit.     ROS: Review of Systems  Constitutional: Positive for chills and malaise/fatigue. Negative for diaphoresis, fever and weight loss.  HENT: Positive for congestion, sinus pain and sore throat. Negative for ear discharge, ear pain, hearing loss, nosebleeds and tinnitus.   Eyes: Negative for blurred vision, double vision, photophobia, pain, discharge and redness.  Respiratory: Positive for cough and sputum production. Negative for hemoptysis, shortness of breath, wheezing and stridor.   Cardiovascular: Negative for chest pain, palpitations, orthopnea, claudication, leg swelling and PND.  Gastrointestinal: Negative for abdominal pain, blood in stool, constipation, diarrhea, heartburn, melena, nausea and vomiting.  Musculoskeletal: Negative for back pain, falls, joint pain, myalgias and neck pain.  Skin: Negative for itching and rash.    Physical Exam:  BP 120/84   Pulse 98   Temp (!) 97.5 F (36.4 C)   Ht 5' 9.5" (1.765 m)  Wt 202 lb (91.6 kg)   SpO2 96%   BMI 29.40 kg/m   General Appearance: Well nourished, in no apparent distress. Eyes: PERRLA, EOMs, conjunctiva no swelling or erythema Sinuses: No Frontal/maxillary tenderness ENT/Mouth: Ext aud canals clear, TMs without erythema, bulging.  Serous noted behind bilateral TM's. Erythema and exudate noted post phatynx, No swelling noted or exudate on post pharynx.  Tonsils not swollen or  erythematous. Hearing normal.  Neck: Supple, thyroid normal.  Respiratory: Respiratory effort normal, BS equal bilaterally without rales, rhonchi, wheezing or stridor.  Cardio: RRR with no MRGs. Brisk peripheral pulses without edema.  Abdomen: Soft, + BS.  Non tender, no guarding, rebound, hernias, masses. Lymphatics: Non tender without lymphadenopathy.  Musculoskeletal: Full ROM, 5/5 strength, normal gait.  Skin: Warm, dry without rashes, lesions, ecchymosis.  Neuro: Cranial nerves intact. Normal muscle tone, no cerebellar symptoms. Sensation intact.  Psych: Awake and oriented X 3, normal affect, Insight and Judgment appropriate.     Elder Negus, NP 1:04 PM [facility]

## 2018-11-21 ENCOUNTER — Encounter: Payer: Self-pay | Admitting: Adult Health Nurse Practitioner

## 2018-11-26 DIAGNOSIS — G4733 Obstructive sleep apnea (adult) (pediatric): Secondary | ICD-10-CM | POA: Diagnosis not present

## 2018-12-15 ENCOUNTER — Encounter: Payer: Self-pay | Admitting: Neurology

## 2018-12-21 ENCOUNTER — Other Ambulatory Visit: Payer: BLUE CROSS/BLUE SHIELD | Admitting: Physician Assistant

## 2018-12-21 DIAGNOSIS — M545 Low back pain, unspecified: Secondary | ICD-10-CM

## 2018-12-21 MED ORDER — TRAMADOL HCL 50 MG PO TABS: 50.0000 mg | ORAL_TABLET | Freq: Three times a day (TID) | ORAL | 0 refills | Status: DC | PRN

## 2018-12-21 MED ORDER — PREDNISONE 20 MG PO TABS: ORAL_TABLET | ORAL | 0 refills | Status: AC

## 2018-12-21 NOTE — Progress Notes (Signed)
   Subjective:    Patient ID: Dale Conley, male    DOB: 1976/08/25, 43 y.o.   MRN: [id number]  HPI 43 y.o. WM called with lower back pain has history. No injury. Started 1 oclock in afternoon Friday. He states left lower back/left buttocks and hip, has tried methocarbamol and ibuprofen are not helping it. States it was 10/10 and now an 8/10. Worse with movement.  No weakness, numbness, tingling in his leg, no urinary issues.   Follows with Dr. Rosanne Gutting and Dr. 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   There were no vitals taken for this visit.  Medications Current Outpatient Medications on File Prior to Visit  Medication Sig  . azithromycin (ZITHROMAX) 250 MG tablet Take 2 tablets PO on day 1, then 1 tablet PO Q24H x 4 days  . methocarbamol (ROBAXIN) 500 MG tablet Take 1 tablet (500 mg total) by mouth 2 (two) times daily as needed for muscle spasms.  . Vitamin D, Ergocalciferol, (DRISDOL) 1.25 MG (50000 UT) CAPS capsule TAKE 1 CAPSULE BY MOUTH 2 DAYS PER WEEK   No current facility-administered medications on file prior to visit.     Problem list He has Back pain; Hypogonadism in male; Essential hypertension; Vitamin D deficiency; Medication management; Obstructive sleep apnea syndrome; and Headache on their problem list.   Review of Systems See HPI    Objective:   Physical Exam  Patient has history of back pain.      Assessment & Plan:  Lower back pain Prednisone was prescribed, RICE, and exercise given 5 days of tramadol was given Follow up with ortho  Patient will call back or mychart message if he has any issues.  Go to the ER if you have any new weakness in your legs, have trouble controlling your urine or bowels, or have worsening pain.   Dale Conley [place] Controlled Substance Reporting System was reviewed prior to prescribing the acute pain medication, no longer than 5 days was prescribed.  . I have reviewed adverse  effects to taking opioids, to include constipation, dizziness, drowsiness, confusion. . I have explained that opiods are safe for short term use for acute pain but are not for chronic pain, if the pain continues the patient needs a reevaluation here in the office or if the pain worsens go to the ER. Marland Kitchen Opioid tolerance, dependence and addiction was discussed with the patient.   . Patient understands and agrees to remain compliant with the treatment plan.    Between 20+ minutes of counseling, chart review, and critical decision making was performed

## 2018-12-25 DIAGNOSIS — G4733 Obstructive sleep apnea (adult) (pediatric): Secondary | ICD-10-CM | POA: Diagnosis not present

## 2019-01-25 DIAGNOSIS — G4733 Obstructive sleep apnea (adult) (pediatric): Secondary | ICD-10-CM | POA: Diagnosis not present

## 2019-02-15 ENCOUNTER — Telehealth: Payer: BLUE CROSS/BLUE SHIELD | Admitting: Physician Assistant

## 2019-02-15 DIAGNOSIS — T753XXA Motion sickness, initial encounter: Secondary | ICD-10-CM

## 2019-02-15 MED ORDER — MECLIZINE HCL 25 MG PO TABS: ORAL_TABLET | ORAL | 0 refills | Status: DC

## 2019-02-15 MED ORDER — SCOPOLAMINE 1 MG/3DAYS TD PT72: 1.0000 | MEDICATED_PATCH | TRANSDERMAL | 0 refills | Status: DC

## 2019-02-15 NOTE — Telephone Encounter (Signed)
-----   Message from Gregery Na, CMA sent at 02/15/2019  1:12 PM EDT ----- Regarding: motion sickness Contact: [phone] Patient reports he was out on a boat all weekend and still having motion sickness since he's been home. Has tried DRAMAMINE  but doesn't help.   Please advise  [facility]   Per office note

## 2019-02-15 NOTE — Telephone Encounter (Signed)
THIS ENCOUNTER IS A VIRTUAL VISIT DUE TO COVID-19 - PATIENT WAS NOT SEEN IN THE OFFICE.  PATIENT HAS CONSENTED TO VIRTUAL VISIT / TELEMEDICINE VISIT   Virtual Visit via telephone Note  I connected with Dale Conley on 02/15/2019  by telephone.  I verified that I am speaking with the correct person using two identifiers.    I discussed the limitations of evaluation and management by telemedicine and the availability of in person appointments. The patient expressed understanding and agreed to proceed.  History of Present Illness: 43 y.o. WM calls with sea sickness/motions sickness. He was on a boat over the weekend and states that while he is resting he continues to feel that he is on the boat. Has tried dramamine but that has not helped.  He denies any associated neurological complications or symptoms, such as one-sided weakness, numbness, tingling, slurring of speech, droopy face, swallowing difficulties, diplopia, vision loss, hearing loss or tinnitus.    Medications       Current Outpatient Medications (Other):  .  azithromycin (ZITHROMAX) 250 MG tablet, Take 2 tablets PO on day 1, then 1 tablet PO Q24H x 4 days .  methocarbamol (ROBAXIN) 500 MG tablet, Take 1 tablet (500 mg total) by mouth 2 (two) times daily as needed for muscle spasms. .  Vitamin D, Ergocalciferol, (DRISDOL) 1.25 MG (50000 UT) CAPS capsule, TAKE 1 CAPSULE BY MOUTH 2 DAYS PER WEEK  Problem list He has Back pain; Hypogonadism in male; Essential hypertension; Vitamin D deficiency; Medication management; Obstructive sleep apnea syndrome; and Headache on their problem list.   Observations/Objective: General Appearance:Well sounding, in no apparent distress.  ENT/Mouth: No hoarseness, No cough for duration of visit.  Respiratory: completing full sentences without distress, without audible wheeze Neuro: Awake and oriented X 3,  Psych:  Insight and Judgment appropriate.   Assessment and Plan: Diagnoses and all  orders for this visit:  Motion sickness, initial encounter -     scopolamine (TRANSDERM-SCOP, 1.5 MG,) 1 MG/3DAYS; Place 1 patch (1.5 mg total) onto the skin every 3 (three) days. -     meclizine (ANTIVERT) 25 MG tablet; 1/2-1 pill up to 3 times daily for motion sickness/dizziness     Follow Up Instructions:    I discussed the assessment and treatment plan with the patient. The patient was provided an opportunity to ask questions and all were answered. The patient agreed with the plan and demonstrated an understanding of the instructions.   The patient was advised to call back or seek an in-person evaluation if the symptoms worsen or if the condition fails to improve as anticipated.  I provided 15 minutes of non-face-to-face time during this encounter.   Quentin Mulling, PA-C

## 2019-02-24 DIAGNOSIS — G4733 Obstructive sleep apnea (adult) (pediatric): Secondary | ICD-10-CM | POA: Diagnosis not present

## 2019-03-23 ENCOUNTER — Ambulatory Visit (INDEPENDENT_AMBULATORY_CARE_PROVIDER_SITE_OTHER): Payer: BC Managed Care – PPO | Admitting: Adult Health

## 2019-03-23 ENCOUNTER — Other Ambulatory Visit: Payer: Self-pay

## 2019-03-23 ENCOUNTER — Encounter: Payer: Self-pay | Admitting: Adult Health

## 2019-03-23 VITALS — BP 130/78 | HR 77 | Temp 97.2°F | Ht 69.5 in | Wt 198.0 lb

## 2019-03-23 DIAGNOSIS — E782 Mixed hyperlipidemia: Secondary | ICD-10-CM | POA: Diagnosis not present

## 2019-03-23 DIAGNOSIS — Z6829 Body mass index (BMI) 29.0-29.9, adult: Secondary | ICD-10-CM

## 2019-03-23 DIAGNOSIS — I10 Essential (primary) hypertension: Secondary | ICD-10-CM

## 2019-03-23 DIAGNOSIS — Z79899 Other long term (current) drug therapy: Secondary | ICD-10-CM | POA: Diagnosis not present

## 2019-03-23 DIAGNOSIS — G4733 Obstructive sleep apnea (adult) (pediatric): Secondary | ICD-10-CM

## 2019-03-23 DIAGNOSIS — E785 Hyperlipidemia, unspecified: Secondary | ICD-10-CM | POA: Insufficient documentation

## 2019-03-23 DIAGNOSIS — E559 Vitamin D deficiency, unspecified: Secondary | ICD-10-CM | POA: Diagnosis not present

## 2019-03-23 DIAGNOSIS — E663 Overweight: Secondary | ICD-10-CM

## 2019-03-23 DIAGNOSIS — T753XXA Motion sickness, initial encounter: Secondary | ICD-10-CM

## 2019-03-23 MED ORDER — METHOCARBAMOL 500 MG PO TABS: 500.0000 mg | ORAL_TABLET | Freq: Two times a day (BID) | ORAL | 2 refills | Status: DC | PRN

## 2019-03-23 MED ORDER — VITAMIN D (ERGOCALCIFEROL) 1.25 MG (50000 UNIT) PO CAPS: ORAL_CAPSULE | ORAL | 1 refills | Status: DC

## 2019-03-23 NOTE — Patient Instructions (Addendum)
Goals    . LDL CALC < 100    . Reduce alcohol intake     <2 drinks per day    . Weight (lb) < 180 lb (81.6 kg)        Aim for 7+ servings of fruits and vegetables daily  65-80+ fluid ounces of water or unsweet tea for healthy kidneys  Limit to max 2 drink of alcohol per day; avoid smoking/tobacco  Limit animal fats in diet for cholesterol and heart health - choose grass fed whenever available  Avoid highly processed foods, and foods high in saturated/trans fats  Aim for low stress - take time to unwind and care for your mental health  Aim for 150 min of moderate intensity exercise weekly for heart health, and weights twice weekly for bone health  Aim for 7-9 hours of sleep daily    Can do zinc 40-50 mg a day Can try shake that is whey protein, almond milk, avocado oil, and spinach and strawberries in the morning  9 Ways to Naturally Increase Testosterone Levels  1.   Lose Weight If you're overweight, shedding the excess pounds may increase your testosterone levels, according to research presented at the Endocrine Society's 2012 meeting. Overweight men are more likely to have low testosterone levels to begin with, so this is an important trick to increase your body's testosterone production when you need it most.  2.   High-Intensity Exercise like Peak Fitness  Short intense exercise has a proven positive effect on increasing testosterone levels and preventing its decline. That's unlike aerobics or prolonged moderate exercise, which have shown to have negative or no effect on testosterone levels. Having a whey protein meal after exercise can further enhance the satiety/testosterone-boosting impact (hunger hormones cause the opposite effect on your testosterone and libido). Here's a summary of what a typical high-intensity Peak Fitness routine might look like: " Warm up for three minutes  " Exercise as hard and fast as you can for 30 seconds. You should feel like you couldn't  possibly go on another few seconds  " Recover at a slow to moderate pace for 90 seconds  " Repeat the high intensity exercise and recovery 7 more times .  3.   Consume Plenty of Zinc The mineral zinc is important for testosterone production, and supplementing your diet for as little as six weeks has been shown to cause a marked improvement in testosterone among men with low levels.1 Likewise, research has shown that restricting dietary sources of zinc leads to a significant decrease in testosterone, while zinc supplementation increases it2 -- and even protects men from exercised-induced reductions in testosterone levels.3 It's estimated that up to 64 percent of adults over the age of 60 may have lower than recommended zinc intakes; even when dietary supplements were added in, an estimated 20-25 percent of older adults still had inadequate zinc intakes, according to a [employer] and Nutrition Examination Survey.4 Your diet is the best source of zinc; along with protein-rich foods like meats and fish, other good dietary sources of zinc include raw milk, raw cheese, beans, and yogurt or kefir made from raw milk. It can be difficult to obtain enough dietary zinc if you're a vegetarian, and also for meat-eaters as well, largely because of conventional farming methods that rely heavily on chemical fertilizers and pesticides. These chemicals deplete the soil of nutrients ... nutrients like zinc that must be absorbed by plants in order to be passed on to you. In many  cases, you may further deplete the nutrients in your food by the way you prepare it. For most food, cooking it will drastically reduce its levels of nutrients like zinc ... particularly over-cooking, which many people do. If you decide to use a zinc supplement, stick to a dosage of less than 40 mg a day, as this is the recommended adult upper limit. Taking too much zinc can interfere with your body's ability to absorb other minerals, especially  copper, and may cause nausea as a side effect.  4.   Strength Training In addition to Peak Fitness, strength training is also known to boost testosterone levels, provided you are doing so intensely enough. When strength training to boost testosterone, you'll want to increase the weight and lower your number of reps, and then focus on exercises that work a large number of muscles, such as dead lifts or squats.  You can "turbo-charge" your weight training by going slower. By slowing down your movement, you're actually turning it into a high-intensity exercise. Super Slow movement allows your muscle, at the microscopic level, to access the maximum number of cross-bridges between the protein filaments that produce movement in the muscle.   5.   Optimize Your Vitamin D Levels Vitamin D, a steroid hormone, is essential for the healthy development of the nucleus of the sperm cell, and helps maintain semen quality and sperm count. Vitamin D also increases levels of testosterone, which may boost libido. In one study, overweight men who were given vitamin D supplements had a significant increase in testosterone levels after one year.5   6.   Reduce Stress When you're under a lot of stress, your body releases high levels of the stress hormone cortisol. This hormone actually blocks the effects of testosterone,6 presumably because, from a biological standpoint, testosterone-associated behaviors (mating, competing, aggression) may have lowered your chances of survival in an emergency (hence, the "fight or flight" response is dominant, courtesy of cortisol).  7.   Limit or Eliminate Sugar from Your Diet Testosterone levels decrease after you eat sugar, which is likely because the sugar leads to a high insulin level, another factor leading to low testosterone.7 Based on USDA estimates, the average American consumes 12 teaspoons of sugar a day, which equates to about TWO TONS of sugar during a lifetime.  8.   Eat  Healthy Fats By healthy, this means not only mon- and polyunsaturated fats, like that found in avocadoes and nuts, but also saturated, as these are essential for building testosterone. Research shows that a diet with less than 40 percent of energy as fat (and that mainly from animal sources, i.e. saturated) lead to a decrease in testosterone levels.8 My personal diet is about 60-70 percent healthy fat, and other experts agree that the ideal diet includes somewhere between 50-70 percent fat.  It's important to understand that your body requires saturated fats from animal and vegetable sources (such as meat, dairy, certain oils, and tropical plants like coconut) for optimal functioning, and if you neglect this important food group in favor of sugar, grains and other starchy carbs, your health and weight are almost guaranteed to suffer. Examples of healthy fats you can eat more of to give your testosterone levels a boost include: Olives and Olive oil  Coconuts and coconut oil Butter made from raw grass-fed organic milk Raw nuts, such as, almonds or pecans Organic pastured egg yolks Avocados Grass-fed meats Palm oil Unheated organic nut oils   9.   Boost Your Intake of  Branch Chain Amino Acids (BCAA) from Foods Like Whey Protein Research suggests that BCAAs result in higher testosterone levels, particularly when taken along with resistance training.9 While BCAAs are available in supplement form, you'll find the highest concentrations of BCAAs like leucine in dairy products - especially quality cheeses and whey protein. Even when getting leucine from your natural food supply, it's often wasted or used as a building block instead of an anabolic agent. So to create the correct anabolic environment, you need to boost leucine consumption way beyond mere maintenance levels. That said, keep in mind that using leucine as a free form amino acid can be highly counterproductive as when free form amino acids are  artificially administrated, they rapidly enter your circulation while disrupting insulin function, and impairing your body's glycemic control. Food-based leucine is really the ideal form that can benefit your muscles without side effects.       Rosuvastatin Tablets What is this medicine? ROSUVASTATIN (roe SOO va sta tin) is known as a HMG-CoA reductase inhibitor or 'statin'. It lowers cholesterol and triglycerides in the blood. This drug may also reduce the risk of heart attack, stroke, or other health problems in patients with risk factors for heart disease. Diet and lifestyle changes are often used with this drug. This medicine may be used for other purposes; ask your health care provider or pharmacist if you have questions. COMMON BRAND NAME(S): Crestor What should I tell my health care provider before I take this medicine? They need to know if you have any of these conditions: -diabetes -if you often drink alcohol -history of stroke -kidney disease -liver disease -muscle aches or weakness -thyroid disease -an unusual or allergic reaction to rosuvastatin, other medicines, foods, dyes, or preservatives -pregnant or trying to get pregnant -breast-feeding How should I use this medicine? Take this medicine by mouth with a glass of water. Follow the directions on the prescription label. Do not cut, crush or chew this medicine. You can take this medicine with or without food. Take your doses at regular intervals. Do not take your medicine more often than directed. Talk to your pediatrician regarding the use of this medicine in children. While this drug may be prescribed for children as young as 43 years old for selected conditions, precautions do apply. Overdosage: If you think you have taken too much of this medicine contact a poison control center or emergency room at once. NOTE: This medicine is only for you. Do not share this medicine with others. What if I miss a dose? If you miss a  dose, take it as soon as you can. If your next dose is to be taken in less than 12 hours, then do not take the missed dose. Take the next dose at your regular time. Do not take double or extra doses. What may interact with this medicine? Do not take this medicine with any of the following medications: -herbal medicines like red yeast rice This medicine may also interact with the following medications: -alcohol -antacids containing aluminum hydroxide or magnesium hydroxide -cyclosporine -other medicines for high cholesterol -some medicines for HIV infection -warfarin This list may not describe all possible interactions. Give your health care provider a list of all the medicines, herbs, non-prescription drugs, or dietary supplements you use. Also tell them if you smoke, drink alcohol, or use illegal drugs. Some items may interact with your medicine. What should I watch for while using this medicine? Visit your doctor or health care professional for regular check-ups. You  may need regular tests to make sure your liver is working properly. Your health care professional may tell you to stop taking this medicine if you develop muscle problems. If your muscle problems do not go away after stopping this medicine, contact your health care professional. Do not become pregnant while taking this medicine. Women should inform their health care professional if they wish to become pregnant or think they might be pregnant. There is a potential for serious side effects to an unborn child. Talk to your health care professional or pharmacist for more information. Do not breast-feed an infant while taking this medicine. This medicine may affect blood sugar levels. If you have diabetes, check with your doctor or health care professional before you change your diet or the dose of your diabetic medicine. If you are going to need surgery or other procedure, tell your doctor that you are using this medicine. This drug is  only part of a total heart-health program. Your doctor or a dietician can suggest a low-cholesterol and low-fat diet to help. Avoid alcohol and smoking, and keep a proper exercise schedule. This medicine may cause a decrease in Co-Enzyme Q-10. You should make sure that you get enough Co-Enzyme Q-10 while you are taking this medicine. Discuss the foods you eat and the vitamins you take with your health care professional. What side effects may I notice from receiving this medicine? Side effects that you should report to your doctor or health care professional as soon as possible: -allergic reactions like skin rash, itching or hives, swelling of the face, lips, or tongue -dark urine -fever -joint pain -muscle cramps, pain -redness, blistering, peeling or loosening of the skin, including inside the mouth -trouble passing urine or change in the amount of urine -unusually weak or tired -yellowing of the eyes or skin Side effects that usually do not require medical attention (report to your doctor or health care professional if they continue or are bothersome): -constipation -heartburn -nausea -stomach gas, pain, upset This list may not describe all possible side effects. Call your doctor for medical advice about side effects. You may report side effects to FDA at 1-800-FDA-1088. Where should I keep my medicine? Keep out of the reach of children. Store at room temperature between 20 and 25 degrees C (68 and 77 degrees F). Keep container tightly closed (protect from moisture). Throw away any unused medicine after the expiration date. NOTE: This sheet is a summary. It may not cover all possible information. If you have questions about this medicine, talk to your doctor, pharmacist, or health care provider.  2019 Elsevier/Gold Standard (2017-05-18 12:42:43)

## 2019-03-23 NOTE — Progress Notes (Signed)
FOLLOW UP  Assessment and Plan:   Hypertension Fairly controlled at this time off of medications. Monitor blood pressure at home; patient to call if consistently greater than 130/80 Continue DASH diet.   Reminder to go to the ER if any CP, SOB, nausea, dizziness, severe HA, changes vision/speech, left arm numbness and tingling and jaw pain.  Cholesterol Currently above goal; has been working on lifestyle;  Discussed LDL goal <100; if remains significantly elevated will initiate statin, med reviewed today Continue low cholesterol diet and exercise.  Check lipid panel.   Overweight with comorbidities Long discussion about weight loss, diet, and exercise Recommended diet heavy in fruits and veggies and low in animal meats, cheeses, and dairy products, appropriate calorie intake Discussed ideal weight for height and initial weight goal (180lb) Patient will continue to work on exercise program and carb restriction; he is aware he needs to cut back on beer intake, goal <2/day Will follow up in 6 months  Vitamin D Def He has been out of supplement for a few weeks; will restart 1610950000 IU twice weekly  Reviewed goal of 60-100  Hypogonadism Off of supplement, had agitation, denies significant symptoms Reviewed natural methods to boost testosterone; zinc 40-50 mg daily, weight loss, HIIT exercises  Continue diet and meds as discussed. Further disposition pending results of labs. Discussed med's effects and SE's.   Over 30 minutes of exam, counseling, chart review, and critical decision making was performed.   Future Appointments  Date Time Provider Department Center  09/25/2019  3:00 PM Quentin Mullingollier, Amanda, PA-C [id number] None    ----------------------------------------------------------------------------------------------------------------------  HPI 43 y.o. male  presents for 6 month follow up on hypertension, cholesterol, weight and vitamin D deficiency.   BMI is Body mass index is  28.82 kg/m., he has been working on diet and exercise, walking 4 miles twice a day and working with a Systems analystpersonal trainer. Has been cutting down on biscuits in AM, no fast food, lean proteins and veggies. He drinks 6-10 of smart waters, admits drinking too much beer.  Wt Readings from Last 3 Encounters:  03/23/19 198 lb (89.8 kg)  11/15/18 202 lb (91.6 kg)  10/05/18 204 lb 6.4 oz (92.7 kg)   Today their BP is BP: 130/78  He does not workout. He denies chest pain, shortness of breath, dizziness.   He is not on cholesterol medication. His cholesterol is not at goal. The cholesterol last visit was:   Lab Results  Component Value Date   CHOL 255 (H) 09/22/2018   HDL 65 09/22/2018   LDLCALC 147 (H) 09/22/2018   TRIG 264 (H) 09/22/2018   CHOLHDL 3.9 09/22/2018   Last U0AA1C in the office was:  Lab Results  Component Value Date   HGBA1C 5.5 05/01/2015   Patient is on Vitamin D supplement, 50000 IU increased from once weekly   Lab Results  Component Value Date   VD25OH 3419 (L) 09/22/2018     He has a history of testosterone deficiency, was given information on natural methods to improve levels. Was on injections/foam but had SE (agitation) and stopped taking.  Lab Results  Component Value Date   TESTOSTERONE 238 (L) 09/22/2018      Current Medications:  Current Outpatient Medications on File Prior to Visit  Medication Sig  . methocarbamol (ROBAXIN) 500 MG tablet Take 1 tablet (500 mg total) by mouth 2 (two) times daily as needed for muscle spasms.  Marland Kitchen. scopolamine (TRANSDERM-SCOP, 1.5 MG,) 1 MG/3DAYS Place 1 patch (1.5 mg  total) onto the skin every 3 (three) days. (Patient taking differently: Place 1 patch onto the skin as needed. )  . Vitamin D, Ergocalciferol, (DRISDOL) 1.25 MG (50000 UT) CAPS capsule TAKE 1 CAPSULE BY MOUTH 2 DAYS PER WEEK   No current facility-administered medications on file prior to visit.      Allergies:  Allergies  Allergen Reactions  . Flexeril  [Cyclobenzaprine]      Medical History:  Past Medical History:  Diagnosis Date  . Hypertension    Family history- Reviewed and unchanged Social history- Reviewed and unchanged   Review of Systems:  Review of Systems  Constitutional: Negative for malaise/fatigue and weight loss.  HENT: Negative for hearing loss and tinnitus.   Eyes: Negative for blurred vision and double vision.  Respiratory: Negative for cough, shortness of breath and wheezing.   Cardiovascular: Negative for chest pain, palpitations, orthopnea, claudication and leg swelling.  Gastrointestinal: Negative for abdominal pain, blood in stool, constipation, diarrhea, heartburn, melena, nausea and vomiting.  Genitourinary: Negative.   Musculoskeletal: Negative for joint pain and myalgias.  Skin: Negative for rash.  Neurological: Negative for dizziness, tingling, sensory change, weakness and headaches.  Endo/Heme/Allergies: Negative for polydipsia.  Psychiatric/Behavioral: Negative.   All other systems reviewed and are negative.     Physical Exam: BP 130/78   Pulse 77   Temp (!) 97.2 F (36.2 C)   Ht 5' 9.5" (1.765 m)   Wt 198 lb (89.8 kg)   SpO2 97%   BMI 28.82 kg/m  Wt Readings from Last 3 Encounters:  03/23/19 198 lb (89.8 kg)  11/15/18 202 lb (91.6 kg)  10/05/18 204 lb 6.4 oz (92.7 kg)   General Appearance: Well nourished, in no apparent distress. Eyes: PERRLA, EOMs, conjunctiva no swelling or erythema Sinuses: No Frontal/maxillary tenderness ENT/Mouth: Ext aud canals clear, TMs without erythema, bulging. No erythema, swelling, or exudate on post pharynx.  Tonsils not swollen or erythematous. Hearing normal.  Neck: Supple, thyroid normal.  Respiratory: Respiratory effort normal, BS equal bilaterally without rales, rhonchi, wheezing or stridor.  Cardio: RRR with no MRGs. Brisk peripheral pulses without edema.  Abdomen: Soft, + BS.  Non tender, no guarding, rebound, hernias, masses. Lymphatics: Non  tender without lymphadenopathy.  Musculoskeletal: Full ROM, 5/5 strength, Normal gait Skin: Warm, dry without rashes, lesions, ecchymosis.  Neuro: Cranial nerves intact. No cerebellar symptoms.  Psych: Awake and oriented X 3, normal affect, Insight and Judgment appropriate.    Izora Ribas, NP 3:56 PM [facility]

## 2019-03-24 ENCOUNTER — Encounter: Payer: Self-pay | Admitting: Adult Health

## 2019-03-24 DIAGNOSIS — R7989 Other specified abnormal findings of blood chemistry: Secondary | ICD-10-CM | POA: Insufficient documentation

## 2019-03-24 LAB — COMPLETE METABOLIC PANEL WITH GFR
AG Ratio: 1.7 (calc) (ref 1.0–2.5)
ALT: 52 U/L — ABNORMAL HIGH (ref 9–46)
AST: 44 U/L — ABNORMAL HIGH (ref 10–40)
Albumin: 4.3 g/dL (ref 3.6–5.1)
Alkaline phosphatase (APISO): 83 U/L (ref 36–130)
BUN: 18 mg/dL (ref 7–25)
CO2: 24 mmol/L (ref 20–32)
Calcium: 9.5 mg/dL (ref 8.6–10.3)
Chloride: 106 mmol/L (ref 98–110)
Creat: 0.9 mg/dL (ref 0.60–1.35)
GFR, Est African American: 122 mL/min/{1.73_m2} (ref >=60)
GFR, Est Non African American: 105 mL/min/{1.73_m2} (ref >=60)
Globulin: 2.6 g/dL (calc) (ref 1.9–3.7)
Glucose, Bld: 75 mg/dL (ref 65–99)
Potassium: 4.2 mmol/L (ref 3.5–5.3)
Sodium: 138 mmol/L (ref 135–146)
Total Bilirubin: 0.5 mg/dL (ref 0.2–1.2)
Total Protein: 6.9 g/dL (ref 6.1–8.1)

## 2019-03-24 LAB — LIPID PANEL
Cholesterol: 225 mg/dL — ABNORMAL HIGH (ref <200)
HDL: 81 mg/dL (ref >=40)
LDL Cholesterol (Calc): 121 mg/dL (calc) — ABNORMAL HIGH
Non-HDL Cholesterol (Calc): 144 mg/dL (calc) — ABNORMAL HIGH (ref <130)
Total CHOL/HDL Ratio: 2.8 (calc) (ref <5.0)
Triglycerides: 121 mg/dL (ref <150)

## 2019-03-27 DIAGNOSIS — G4733 Obstructive sleep apnea (adult) (pediatric): Secondary | ICD-10-CM | POA: Diagnosis not present

## 2019-04-26 DIAGNOSIS — G4733 Obstructive sleep apnea (adult) (pediatric): Secondary | ICD-10-CM | POA: Diagnosis not present

## 2019-05-22 ENCOUNTER — Telehealth: Payer: BLUE CROSS/BLUE SHIELD | Admitting: Family

## 2019-05-22 DIAGNOSIS — R197 Diarrhea, unspecified: Secondary | ICD-10-CM

## 2019-05-22 NOTE — Progress Notes (Signed)
Based on what you shared with me, I feel your condition warrants further evaluation and I recommend that you be seen for a face to face office visit.  NOTE: If you entered your credit card information for this eVisit, you will not be charged. You may see a "hold" on your card for the $35 but that hold will drop off and you will not have a charge processed.  Given that you're having diarrhea with red and abdomen pain you need to be seen face-to-face  If you are having a true medical emergency please call 911.     For an urgent face to face visit, [place] has five urgent care centers for your convenience:   [email or web address] to reserve your spot online an avoid wait times  [address], [place], [place] [address] *Just off [address], across the road from [place] hours of operation: Monday-Friday, 12 PM to 6 PM  Closed Saturday & Sunday    . [facility]    [phone]                  Get Driving Directions  [address], [place] [address] . 10 am to 8 pm Monday-Friday . 12 pm to 8 pm Saturday-Sunday   . [facility] at [place]                  Get Driving Directions  [address], [place], [place] [address] . 8 am to 8 pm Monday-Friday . 9 am to 6 pm Saturday . 11 am to 6 pm Sunday   . [facility] at [place]                  Get Driving Directions   [address] [place], [place] [address] . 8 am to 8 pm Monday-Friday . 8 am to 4 pm Saturday-Sunday    . [facility] at [place]                    Get Driving Directions  [phone]  [address] [place], [place] [address]  . Monday-Friday, 12 PM to 6 PM    Your e-visit answers were reviewed by a board certified advanced clinical practitioner to complete your personal care plan.   Thank you for using e-Visits.

## 2019-05-27 DIAGNOSIS — G4733 Obstructive sleep apnea (adult) (pediatric): Secondary | ICD-10-CM | POA: Diagnosis not present

## 2019-06-01 ENCOUNTER — Ambulatory Visit (INDEPENDENT_AMBULATORY_CARE_PROVIDER_SITE_OTHER): Payer: BC Managed Care – PPO | Admitting: Adult Health Nurse Practitioner

## 2019-06-01 ENCOUNTER — Other Ambulatory Visit: Payer: Self-pay

## 2019-06-01 ENCOUNTER — Telehealth: Payer: Self-pay | Admitting: Physician Assistant

## 2019-06-01 ENCOUNTER — Encounter: Payer: Self-pay | Admitting: Adult Health Nurse Practitioner

## 2019-06-01 VITALS — BP 120/80 | HR 73 | Temp 97.3°F | Ht 69.5 in | Wt 187.2 lb

## 2019-06-01 DIAGNOSIS — R197 Diarrhea, unspecified: Secondary | ICD-10-CM

## 2019-06-01 DIAGNOSIS — E291 Testicular hypofunction: Secondary | ICD-10-CM | POA: Diagnosis not present

## 2019-06-01 DIAGNOSIS — Z8379 Family history of other diseases of the digestive system: Secondary | ICD-10-CM

## 2019-06-01 DIAGNOSIS — Z6827 Body mass index (BMI) 27.0-27.9, adult: Secondary | ICD-10-CM

## 2019-06-01 DIAGNOSIS — E559 Vitamin D deficiency, unspecified: Secondary | ICD-10-CM

## 2019-06-01 DIAGNOSIS — Z79899 Other long term (current) drug therapy: Secondary | ICD-10-CM

## 2019-06-01 DIAGNOSIS — K921 Melena: Secondary | ICD-10-CM

## 2019-06-01 LAB — CBC WITH DIFFERENTIAL/PLATELET
Absolute Monocytes: 670 cells/uL (ref 200–950)
Basophils Absolute: 50 cells/uL (ref 0–200)
Basophils Relative: 0.7 %
Eosinophils Absolute: 187 cells/uL (ref 15–500)
Eosinophils Relative: 2.6 %
HCT: 47.9 % (ref 38.5–50.0)
Hemoglobin: 16.5 g/dL (ref 13.2–17.1)
Lymphs Abs: 1800 cells/uL (ref 850–3900)
MCH: 32.9 pg (ref 27.0–33.0)
MCHC: 34.4 g/dL (ref 32.0–36.0)
MCV: 95.4 fL (ref 80.0–100.0)
MPV: 9.8 fL (ref 7.5–12.5)
Monocytes Relative: 9.3 %
Neutro Abs: 4493 cells/uL (ref 1500–7800)
Neutrophils Relative %: 62.4 %
Platelets: 231 10*3/uL (ref 140–400)
RBC: 5.02 10*6/uL (ref 4.20–5.80)
RDW: 12 % (ref 11.0–15.0)
Total Lymphocyte: 25 %
WBC: 7.2 10*3/uL (ref 3.8–10.8)

## 2019-06-01 NOTE — Telephone Encounter (Signed)
error 

## 2019-06-01 NOTE — Progress Notes (Signed)
Assessment and Plan:  Dale RuskJeremy was seen today for acute visit, irritable bowel syndrome and diarrhea.  Discussed multiple etiologies.  Discussed returning samples prior to any treatments.  Discussed initiating treatment for presumed infectious etiology related to symptoms and duration.  Diagnoses and all orders for this visit:  Diarrhea of presumed infectious origin Discussed dietary modifications -     Ova and parasite examination; Future -     Cancel: Clostridium difficile Toxin A/B; Future -     CBC with Differential/Platelet -     C. difficile GDH and Toxin A/B; Future Add probiotic daily  Melena -     POC Hemoccult Bld/Stl (3-Cd Home Screen); Future Continue to monitor  Vitamin D deficiency Continue supplementation Will monitor next routine visit  Hypogonadism in male Doing well at this time Exercising daily  BMI 27.0-27.9 Discussed dietary and exercise modifications STOP metabolism supplements with caffeine Cause of diarrhea?  Medication management Continued  Family history of Diverticulitis Discussed dietary changes No colonoscopy on record Will continue to monitor Sent in treatment for diverticulitis if symptoms worsen over weekend.    Patient agrees with plan of care.  Discussed when to call or return and hospital precautions. Further disposition pending results of labs. Discussed med's effects and SE's.   Over 30 minutes of exam, counseling, chart review, and critical decision making was performed.   Future Appointments  Date Time Provider Department Center  09/25/2019  3:00 PM Quentin Mullingollier, Amanda, PA-C [facility] None    ------------------------------------------------------------------------------------------------------------------   HPI 43 y.o.male presents for evaluated of diarrhea.  Reports that he has had diarrhea for the past three weeks.  Reports [place] and though maybe he ingested some of the water.  He is  concerned with the persistence and duration of his symptoms. He tried some imodium that completely stops him from any bowel movements. He reports two weeks ago he was having BM almost every hour.   Now he is going 3-4 times a day and it is watery.  Denies any nausea & vomiting.  He reports he generally eats healthy and he has been exercising .  Reports he drinks two 16oz bottles first thing in the morning after exercise.  Denies any abdominal pains but has some cramping prior to bowel movement in LLQ and stops after BM.  He has noted some dark red blood in the toilet and unsure if it is on the tissue.   He also takes a metabolisms tablet that has caffeine.  He started this two months prior to this and did not have any side effects.  He has stopped this as of two weeks ago to see if it was the cause, but diarrhea continues.  He has taken some imodium and this does stop the bowel movements all together but them returns.  Denies any changes in eating pattern, new mediations or OTC supplements.  Denies any nausea vomiting, severe abdominal pains or back pains.  Denies chest pains, shortness of breath or headaches.   Past Medical History:  Diagnosis Date  . Hypertension      Allergies  Allergen Reactions  . Flexeril [Cyclobenzaprine]     Current Outpatient Medications on File Prior to Visit  Medication Sig  . methocarbamol (ROBAXIN) 500 MG tablet Take 1 tablet (500 mg total) by mouth 2 (two) times daily as needed for muscle spasms.  Marland Kitchen. scopolamine (TRANSDERM-SCOP, 1.5 MG,) 1 MG/3DAYS Place 1 patch (1.5 mg total) onto the skin every  3 (three) days. (Patient taking differently: Place 1 patch onto the skin as needed. )  . Vitamin D, Ergocalciferol, (DRISDOL) 1.25 MG (50000 UT) CAPS capsule TAKE 1 CAPSULE BY MOUTH 2 DAYS PER WEEK   No current facility-administered medications on file prior to visit.     ROS: all negative except above.   Physical Exam:  BP 120/80   Pulse 73   Temp (!) 97.3 F (36.3  C)   Ht 5' 9.5" (1.765 m)   Wt 187 lb 3.2 oz (84.9 kg)   SpO2 98%   BMI 27.25 kg/m   General Appearance: Well nourished, in no apparent distress. Eyes: PERRLA, EOMs, conjunctiva no swelling or erythema Sinuses: No Frontal/maxillary tenderness ENT/Mouth: Ext aud canals clear, TMs without erythema, bulging. No erythema, swelling, or exudate on post pharynx.  Tonsils not swollen or erythematous. Hearing normal.  Neck: Supple, thyroid normal.  Respiratory: Respiratory effort normal, BS equal bilaterally without rales, rhonchi, wheezing or stridor.  Cardio: RRR with no MRGs. Brisk peripheral pulses without edema.  Abdomen: Soft, + BS.  Non tender, no guarding, rebound, hernias, masses. Lymphatics: Non tender without lymphadenopathy.  Musculoskeletal: Full ROM, 5/5 strength, normal gait.  Skin: Warm, dry without rashes, lesions, ecchymosis.  Neuro: Cranial nerves intact. Normal muscle tone, no cerebellar symptoms. Sensation intact.  Psych: Awake and oriented X 3, normal affect, Insight and Judgment appropriate.     Garnet Sierras, NP 10:23 AM [facility]

## 2019-06-01 NOTE — Patient Instructions (Addendum)
Today we are going to provide you with items to obtain a stool specimen.  We will contact you via MyChart with the results.   We are going to send in Ciprofloxacin and Flagyl antibiotics.  Do not start these until after you have obtained a stool sample.   Please contact us if you have any new or worsening symptoms.

## 2019-06-02 ENCOUNTER — Other Ambulatory Visit: Payer: Self-pay | Admitting: Adult Health Nurse Practitioner

## 2019-06-02 DIAGNOSIS — R197 Diarrhea, unspecified: Secondary | ICD-10-CM

## 2019-06-02 MED ORDER — CIPROFLOXACIN HCL 500 MG PO TABS: 500.0000 mg | ORAL_TABLET | Freq: Two times a day (BID) | ORAL | 0 refills | Status: AC

## 2019-06-02 MED ORDER — METRONIDAZOLE 500 MG PO TABS: 500.0000 mg | ORAL_TABLET | Freq: Three times a day (TID) | ORAL | 0 refills | Status: AC

## 2019-06-06 ENCOUNTER — Other Ambulatory Visit: Payer: Self-pay | Admitting: Adult Health Nurse Practitioner

## 2019-06-06 ENCOUNTER — Other Ambulatory Visit: Payer: Self-pay | Admitting: Internal Medicine

## 2019-06-06 DIAGNOSIS — K921 Melena: Secondary | ICD-10-CM | POA: Diagnosis not present

## 2019-06-06 DIAGNOSIS — E559 Vitamin D deficiency, unspecified: Secondary | ICD-10-CM | POA: Diagnosis not present

## 2019-06-06 DIAGNOSIS — E291 Testicular hypofunction: Secondary | ICD-10-CM | POA: Diagnosis not present

## 2019-06-06 DIAGNOSIS — R197 Diarrhea, unspecified: Secondary | ICD-10-CM | POA: Diagnosis not present

## 2019-06-09 LAB — C. DIFFICILE GDH AND TOXIN A/B
GDH ANTIGEN: NOT DETECTED
MICRO NUMBER:: 856411
SPECIMEN QUALITY:: ADEQUATE
TOXIN A AND B: NOT DETECTED

## 2019-06-09 LAB — OVA AND PARASITE EXAMINATION
CONCENTRATE RESULT:: NONE SEEN
MICRO NUMBER:: 856412
SPECIMEN QUALITY:: ADEQUATE
TRICHROME RESULT:: NONE SEEN

## 2019-06-27 DIAGNOSIS — G4733 Obstructive sleep apnea (adult) (pediatric): Secondary | ICD-10-CM | POA: Diagnosis not present

## 2019-07-05 DIAGNOSIS — F4320 Adjustment disorder, unspecified: Secondary | ICD-10-CM | POA: Diagnosis not present

## 2019-07-12 DIAGNOSIS — F4320 Adjustment disorder, unspecified: Secondary | ICD-10-CM | POA: Diagnosis not present

## 2019-07-18 DIAGNOSIS — F4320 Adjustment disorder, unspecified: Secondary | ICD-10-CM | POA: Diagnosis not present

## 2019-07-19 ENCOUNTER — Encounter: Payer: Self-pay | Admitting: Adult Health

## 2019-07-19 ENCOUNTER — Other Ambulatory Visit: Payer: Self-pay

## 2019-07-19 ENCOUNTER — Ambulatory Visit (INDEPENDENT_AMBULATORY_CARE_PROVIDER_SITE_OTHER): Payer: BC Managed Care – PPO | Admitting: Adult Health

## 2019-07-19 VITALS — BP 116/80 | HR 71 | Temp 96.6°F | Ht 69.5 in | Wt 189.0 lb

## 2019-07-19 DIAGNOSIS — M25511 Pain in right shoulder: Secondary | ICD-10-CM | POA: Diagnosis not present

## 2019-07-19 DIAGNOSIS — M25532 Pain in left wrist: Secondary | ICD-10-CM | POA: Diagnosis not present

## 2019-07-19 DIAGNOSIS — Z23 Encounter for immunization: Secondary | ICD-10-CM

## 2019-07-19 MED ORDER — MELOXICAM 15 MG PO TABS: 15.0000 mg | ORAL_TABLET | Freq: Every day | ORAL | 1 refills | Status: DC

## 2019-07-19 MED ORDER — TRAMADOL HCL 50 MG PO TABS: ORAL_TABLET | ORAL | 0 refills | Status: DC

## 2019-07-19 NOTE — Progress Notes (Signed)
Assessment and Plan:  Paxton was seen today for shoulder pain and wrist pain.  Diagnoses and all orders for this visit:  Acute pain of right shoulder/ Left wrist pain No traumatic events; anterior shoulder pain, left wrist pain with numbness likely carpal tunnel with + phalen's Not improving with conservative interventions (NSAIDS, brace, icing, rest) Requests referral to orthopedics which I will provide Imaging deferred to them Continue daily antiinflammatories; PRN tramadol sparingly for severe pain, at night; PDMP reviewed and cautioned not to take with alcohol or other sedating agents or while driving -     Ambulatory referral to Orthopedics -     meloxicam (MOBIC) 15 MG tablet; Take 1 tablet (15 mg total) by mouth daily. -     traMADol (ULTRAM) 50 MG tablet; Take 1 tab every 6 hours as needed for severe pain. Do not take with other sedating medications or alcohol. Do not take while driving.  Need for influenza vaccine - Quadrivalent administered without complication  Further disposition pending results of labs. Discussed med's effects and SE's.   Over 15 minutes of exam, counseling, chart review, and critical decision making was performed.   Future Appointments  Date Time Provider [place]  09/25/2019  3:00 PM Vicie Mutters, PA-C [facility] None    ------------------------------------------------------------------------------------------------------------------   HPI BP 116/80   Pulse 71   Temp (!) 96.6 F (35.9 C)   Ht 5' 9.5" (1.765 m)   Wt 189 lb (85.7 kg)   SpO2 98%   BMI 27.51 kg/m   43 y.o.male presents to reqeust referral to orthopedics due to persistent R should and left wrist pain; previously established with [facility] for chronic back pain that was recently improved due to more regular exercise.   He does some heavy lifting occasionally at work; no recent traumatic events; former tennis playing, current golfer. He is R handed.   He  reports gradual onset, initially intermittent but now constant/daily/persistent R shoulder (anterior and lateral) pain and left wrist pain x 6 weeks; R shoulder pain with external rotation, wakes him up consistently when he rolls over at night; aching/burning, 5-7/10, non-localized, non radiating, "feels like it's inflamed" - has appeared swollen at one point. He has tried ibuprofen, aleve (alternating), has tried methocarbamol 500 mg which has not been helpful.   He also has left wrist pain with intermittent numbness/tingling, has been taping or wearing a brace which does help but not improving with regular NSAIDs.   Past Medical History:  Diagnosis Date  . Hypertension      Allergies  Allergen Reactions  . Flexeril [Cyclobenzaprine]     Current Outpatient Medications on File Prior to Visit  Medication Sig  . methocarbamol (ROBAXIN) 500 MG tablet Take 1 tablet (500 mg total) by mouth 2 (two) times daily as needed for muscle spasms.  Marland Kitchen scopolamine (TRANSDERM-SCOP, 1.5 MG,) 1 MG/3DAYS Place 1 patch (1.5 mg total) onto the skin every 3 (three) days. (Patient taking differently: Place 1 patch onto the skin as needed. )  . Vitamin D, Ergocalciferol, (DRISDOL) 1.25 MG (50000 UT) CAPS capsule TAKE 1 CAPSULE BY MOUTH 2 DAYS PER WEEK   No current facility-administered medications on file prior to visit.     ROS: all negative except above.   Physical Exam:  BP 116/80   Pulse 71   Temp (!) 96.6 F (35.9 C)   Ht 5' 9.5" (1.765 m)   Wt 189 lb (85.7 kg)   SpO2 98%   BMI 27.51 kg/m  General Appearance: Well nourished, in no apparent distress. Eyes: conjunctiva no swelling or erythema ENT/Mouth: Hearing normal.  Neck: Supple Respiratory: Respiratory effort normal, BS equal bilaterally without rales, rhonchi, wheezing or stridor.  Cardio: RRR with no MRGs. Brisk peripheral pulses without edema.  Lymphatics: Non tender without lymphadenopathy.  Musculoskeletal: R shoulder with anterior  pain and tenderness, pain worse with adduction/liftoff and extention, full lateral abduction; no palpable bony abnormality, clicking/popping, no effusion at this time. L wrist with tenderness, pain with flexion, limited flexion bilaterally, L + phalen's Otherwise no obvious deformity, Full ROM, normal gait.  Skin: Warm, dry without rashes, lesions, ecchymosis.  Neuro: Normal muscle tone, Sensation intact.  Psych: Awake and oriented X 3, normal affect, Insight and Judgment appropriate.     Dan Maker, NP 10:45 AM [facility]

## 2019-07-20 DIAGNOSIS — F4320 Adjustment disorder, unspecified: Secondary | ICD-10-CM | POA: Diagnosis not present

## 2019-07-25 DIAGNOSIS — F4320 Adjustment disorder, unspecified: Secondary | ICD-10-CM | POA: Diagnosis not present

## 2019-07-26 DIAGNOSIS — F4320 Adjustment disorder, unspecified: Secondary | ICD-10-CM | POA: Diagnosis not present

## 2019-07-28 ENCOUNTER — Other Ambulatory Visit: Payer: Self-pay

## 2019-07-28 ENCOUNTER — Encounter: Payer: Self-pay | Admitting: Family Medicine

## 2019-07-28 ENCOUNTER — Ambulatory Visit: Payer: Self-pay

## 2019-07-28 ENCOUNTER — Ambulatory Visit (INDEPENDENT_AMBULATORY_CARE_PROVIDER_SITE_OTHER): Payer: BC Managed Care – PPO | Admitting: Family Medicine

## 2019-07-28 DIAGNOSIS — M25511 Pain in right shoulder: Secondary | ICD-10-CM

## 2019-07-28 DIAGNOSIS — M25531 Pain in right wrist: Secondary | ICD-10-CM

## 2019-07-28 DIAGNOSIS — R200 Anesthesia of skin: Secondary | ICD-10-CM

## 2019-07-28 DIAGNOSIS — M25532 Pain in left wrist: Secondary | ICD-10-CM

## 2019-07-28 MED ORDER — HYDROCODONE-ACETAMINOPHEN 5-325 MG PO TABS: 1.0000 | ORAL_TABLET | Freq: Four times a day (QID) | ORAL | 0 refills | Status: DC | PRN

## 2019-07-28 MED ORDER — PREDNISONE 10 MG PO TABS: ORAL_TABLET | ORAL | 0 refills | Status: DC

## 2019-07-28 NOTE — Progress Notes (Signed)
Office Visit Note   Patient: Dale Conley           Date of Birth: [birth year]           MRN: [id number] Visit Date: 07/28/2019 Requested by: Judd Gaudier, NP [address] [place],  [place] [address] PCP: Lucky Cowboy, MD  Subjective: Chief Complaint  Patient presents with  . Right Shoulder - Pain    Anterior shoulder pain x 2 months, NKI. Meloxicam helps.  . numbness bil wrists, l > r (right-handed)    HPI: He is here with right shoulder and bilateral wrist pain.  Shoulder started hurting a couple months ago with no injury.  He started noticing pain on the lateral aspect of the shoulder with reaching overhead or behind his back.  He works out on a regular basis and his job is fairly physically demanding at times, but there was never 1 moment where he felt like he hurt himself.  He has had trouble sleeping because of his shoulder pain.  His PCP called him tramadol and meloxicam recently and these seem to be helping.  Also in the past couple months he has had left greater than right wrist pain.  Again, no injury.  Pain on the dorsum of his wrist, worse when trying to flex his wrist.  He is wearing braces to sleep at night.  He gets occasional numbness in his hands.  No family history of gout or other rheumatologic disease to his knowledge.               ROS: Denies fevers or chills.  All other systems were reviewed and are negative.  Objective: Vital Signs: There were no vitals taken for this visit.  Physical Exam:  General:  Alert and oriented, in no acute distress. Pulm:  Breathing unlabored. Psy:  Normal mood, congruent affect. Skin: No visible rash. Right shoulder: Full active range of motion with pain reaching behind the back and reaching overhead.  There is palpable popping in the subacromial area with overhead reach.  He is tender to palpation in the lateral subacromial space.  Isometric rotator cuff strength is still 5/5 throughout but he does have  some pain with empty can test and with external rotation.  He is also very tender at the [facility] joint with a positive AC crossover test. Wrists: There is slight synovitis of the left wrist.  No warmth or erythema.  Both wrists are tender diffusely on the dorsum.  He has limited flexion of his wrist due to pain.   Imaging: X-rays right shoulder: Moderate AC joint arthropathy.  No significant glenohumeral change.  No soft tissue calcifications.  X-rays both wrists: Very mild first [facility]MC arthrosis in both wrists.  No other significant abnormality seen, no chondrocalcinosis.    Assessment & Plan: 1.  Right shoulder pain possibly due to [facility] joint arthropathy -Discussed options with him, he cannot do physical therapy due to his busy schedule.  He would like to try an [facility] joint injection.  If this does not help, then could contemplate subacromial injection.  2.  Bilateral wrist pain with synovitis -Prednisone taper.  Labs if symptoms persist.     Procedures: Ultrasound-guided right AC joint injection: After sterile prep with Betadine, injected 3 cc 1% lidocaine without epinephrine and 40 mg methylprednisolone into the AC joint.  Injectate was seen filling the joint.  He had modest improvement during the anesthetic phase.    PMFS History: Patient Active Problem List  Diagnosis Date Noted  . Elevated LFTs 03/24/2019  . Overweight (BMI 25.0-29.9) 03/23/2019  . Hyperlipidemia 03/23/2019  . Headache 09/22/2018  . Obstructive sleep apnea syndrome 09/16/2017  . Essential hypertension 05/04/2016  . Vitamin D deficiency 05/04/2016  . Medication management 05/04/2016  . Hypogonadism in male 08/14/2015  . Back pain 11/27/2014   Past Medical History:  Diagnosis Date  . Hypertension     Family History  Problem Relation Age of Onset  . Heart disease Father 56       CABG  . Hypertension Father   . Hyperlipidemia Father   . Diabetes Paternal Grandfather     No past surgical history on file.  Social History   Occupational History  . Not on file  Tobacco Use  . Smoking status: Never Smoker  . Smokeless tobacco: Never Used  Substance and Sexual Activity  . Alcohol use: Yes    Alcohol/week: 14.0 standard drinks    Types: 14 Standard drinks or equivalent per week    Comment: beer  . Drug use: No  . Sexual activity: Yes

## 2019-07-31 DIAGNOSIS — F4320 Adjustment disorder, unspecified: Secondary | ICD-10-CM | POA: Diagnosis not present

## 2019-08-01 DIAGNOSIS — Z20828 Contact with and (suspected) exposure to other viral communicable diseases: Secondary | ICD-10-CM | POA: Diagnosis not present

## 2019-08-01 DIAGNOSIS — Z03818 Encounter for observation for suspected exposure to other biological agents ruled out: Secondary | ICD-10-CM | POA: Diagnosis not present

## 2019-08-01 DIAGNOSIS — F4320 Adjustment disorder, unspecified: Secondary | ICD-10-CM | POA: Diagnosis not present

## 2019-08-02 DIAGNOSIS — F4320 Adjustment disorder, unspecified: Secondary | ICD-10-CM | POA: Diagnosis not present

## 2019-08-03 DIAGNOSIS — F4320 Adjustment disorder, unspecified: Secondary | ICD-10-CM | POA: Diagnosis not present

## 2019-08-04 DIAGNOSIS — F4320 Adjustment disorder, unspecified: Secondary | ICD-10-CM | POA: Diagnosis not present

## 2019-08-08 DIAGNOSIS — F4320 Adjustment disorder, unspecified: Secondary | ICD-10-CM | POA: Diagnosis not present

## 2019-08-09 DIAGNOSIS — Z20828 Contact with and (suspected) exposure to other viral communicable diseases: Secondary | ICD-10-CM | POA: Diagnosis not present

## 2019-08-11 DIAGNOSIS — F4320 Adjustment disorder, unspecified: Secondary | ICD-10-CM | POA: Diagnosis not present

## 2019-08-15 DIAGNOSIS — F4320 Adjustment disorder, unspecified: Secondary | ICD-10-CM | POA: Diagnosis not present

## 2019-08-18 ENCOUNTER — Encounter: Payer: Self-pay | Admitting: Family Medicine

## 2019-08-18 ENCOUNTER — Other Ambulatory Visit: Payer: Self-pay

## 2019-08-18 ENCOUNTER — Ambulatory Visit: Payer: BC Managed Care – PPO | Admitting: Family Medicine

## 2019-08-18 DIAGNOSIS — M25531 Pain in right wrist: Secondary | ICD-10-CM | POA: Diagnosis not present

## 2019-08-18 DIAGNOSIS — R200 Anesthesia of skin: Secondary | ICD-10-CM | POA: Diagnosis not present

## 2019-08-18 DIAGNOSIS — M25532 Pain in left wrist: Secondary | ICD-10-CM

## 2019-08-18 MED ORDER — NABUMETONE 750 MG PO TABS: 750.0000 mg | ORAL_TABLET | Freq: Two times a day (BID) | ORAL | 6 refills | Status: DC | PRN

## 2019-08-18 NOTE — Progress Notes (Signed)
Office Visit Note   Patient: Dale Conley           Date of Birth: 1976/04/11           MRN: [id number] Visit Date: 08/18/2019 Requested by: Unk Pinto, [address] [place],  [place] [address] PCP: Unk Pinto, MD  Subjective: Chief Complaint  Patient presents with  . Left Wrist - Pain    Persistent pain bilateral wrists. Left wrist hurts worse than right. Wakes up with pain. 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  . Right Wrist - Pain    HPI: He is here with persistent left greater than right wrist pain.  He has been using wrist braces during the daytime with only temporary relief.  Both wrists hurt in the same pattern, pain on the volar aspect more toward the ulnar side and occasionally in the anatomic snuffbox area.  He also gets occasional numbness in his fingers still, he thinks that all of him get numb, not just in the median nerve distribution.  To review, he denies any trauma to his wrist and denies any family history of arthritic conditions but he does have a family history of carpal tunnel syndrome in his mother.              ROS: No fevers or chills.  All other systems were reviewed and are negative.  Objective: Vital Signs: There were no vitals taken for this visit.  Physical Exam:  General:  Alert and oriented, in no acute distress. Pulm:  Breathing unlabored. Psy:  Normal mood, congruent affect. Skin: No erythema or temperature change. Wrists: He still has a little bit of synovitis on the dorsum of his wrist, left greater than right.  Cannot completely reproduce pain by palpation.  Positive Tinel's of carpal tunnel on both sides and positive Phalen's test for numbness in all 5 fingers.  Imaging: None today.  Assessment & Plan: 1.  Persistent left greater than right wrist pain, etiology uncertain.  Could be carpal tunnel syndrome but cannot rule out inflammatory arthropathy. -Labs today.  He will start  sleeping in his wrist splints.  Trial of Relafen. -If labs are normal and symptoms do not improve, then nerve conduction studies.     Procedures: No procedures performed  No notes on file     PMFS History: Patient Active Problem List   Diagnosis Date Noted  . Elevated LFTs 03/24/2019  . Overweight (BMI 25.0-29.9) 03/23/2019  . Hyperlipidemia 03/23/2019  . Headache 09/22/2018  . Obstructive sleep apnea syndrome 09/16/2017  . Essential hypertension 05/04/2016  . Vitamin D deficiency 05/04/2016  . Medication management 05/04/2016  . Hypogonadism in male 08/14/2015  . Back pain 11/27/2014   Past Medical History:  Diagnosis Date  . Hypertension     Family History  Problem Relation Age of Onset  . Heart disease Father 58       CABG  . Hypertension Father   . Hyperlipidemia Father   . Diabetes Paternal Grandfather     History reviewed. No pertinent surgical history. Social History   Occupational History  . Not on file  Tobacco Use  . Smoking status: Never Smoker  . Smokeless tobacco: Never Used  Substance and Sexual Activity  . Alcohol use: Yes    Alcohol/week: 14.0 standard drinks    Types: 14 Standard drinks or equivalent per week    Comment: beer  . Drug use: No  . Sexual activity: Yes

## 2019-08-19 ENCOUNTER — Telehealth: Payer: Self-pay | Admitting: Family Medicine

## 2019-08-19 NOTE — Telephone Encounter (Signed)
Two results still pending, others look good so far.

## 2019-08-20 LAB — ANTI-NUCLEAR AB-TITER (ANA TITER): ANA Titer 1: 1:40 {titer} — ABNORMAL HIGH

## 2019-08-20 LAB — ANA: Anti Nuclear Antibody (ANA): POSITIVE — AB

## 2019-08-20 LAB — CYCLIC CITRUL PEPTIDE ANTIBODY, IGG: Cyclic Citrullin Peptide Ab: <16 UNITS

## 2019-08-20 LAB — C-REACTIVE PROTEIN: CRP: 0.2 mg/L (ref <8.0)

## 2019-08-20 LAB — RHEUMATOID FACTOR: Rheumatoid fact SerPl-aCnc: <14 IU/mL (ref <14)

## 2019-08-20 LAB — SEDIMENTATION RATE: Sed Rate: 2 mm/h (ref 0–15)

## 2019-08-20 LAB — URIC ACID: Uric Acid, Serum: 5.3 mg/dL (ref 4.0–8.0)

## 2019-08-21 ENCOUNTER — Telehealth: Payer: Self-pay | Admitting: Family Medicine

## 2019-08-21 NOTE — Telephone Encounter (Signed)
Labs show:  ANA was mildly positive.  This indicates autoimmune disease of some sort.    All other labs looked good.

## 2019-09-04 DIAGNOSIS — F4320 Adjustment disorder, unspecified: Secondary | ICD-10-CM | POA: Diagnosis not present

## 2019-09-06 DIAGNOSIS — F4320 Adjustment disorder, unspecified: Secondary | ICD-10-CM | POA: Diagnosis not present

## 2019-09-14 ENCOUNTER — Other Ambulatory Visit: Payer: Self-pay | Admitting: Adult Health

## 2019-09-14 DIAGNOSIS — M25511 Pain in right shoulder: Secondary | ICD-10-CM

## 2019-09-20 NOTE — Progress Notes (Deleted)
Complete Physical  Assessment and Plan:  Hypogonadism in male Hypogonadism-  check testosterone levels as needed.   Bilateral low back pain without sciatica - no red flags, declines PT  Vitamin D deficiency -     VITAMIN D 25 Hydroxy (Vit-D Deficiency, Fractures)  Essential hypertension -     CBC with Differential/Platelet -     BASIC METABOLIC PANEL WITH GFR -     Hepatic function panel -     TSH -     Urinalysis, Routine w reflex microscopic (not at [facility]) -     Microalbumin / creatinine urine ratio -     EKG 12-Lead  Routine general medical examination at a health care facility 1 year  Medication management -     Magnesium  BMI 29.0-29.9,adult  Sleep apnea, unspecified type Get consistant  Screening cholesterol level -     Lipid panel  Discussed med's effects and SE's. Screening labs and tests as requested with regular follow-up as recommended. Over 40 minutes of exam, counseling, chart review and critical decision making was performed Future Appointments  Date Time Provider Department Center  09/25/2019  3:00 PM Quentin MullingCollier, Ether Goebel, PA-C [facility] None  09/24/2020  3:00 PM Quentin Mullingollier, Vern Prestia, PA-C [facility] None     HPI  This very nice 43 y.o.male presents for complete physical.  Patient has no major health issues.    He had a sleep study and recommend CPAP from [facility]. He is on the nose piece, could not tolerate it. States last several nights not able to wear it due to nasal congestion. Could not tolerate mouth piece.   He does not workout anymore due to heat and new baby. He has a history of elevated BP, worse when he comes to the doctors, has checked at home and it is okay, BP today is    Finally, patient has history of Vitamin D Deficiency and last vitamin D was  Currently on supplementation.  Lab Results  Component Value Date   VD25OH 19 (L) 09/22/2018   Lab Results  Component Value Date   CHOL 225 (H) 03/23/2019   HDL 81 03/23/2019   LDLCALC 121 (H)  03/23/2019   TRIG 121 03/23/2019   CHOLHDL 2.8 03/23/2019   Married with new baby girl, she is 4 Year and 1.7 months old, [place]. Wife is Fish farm managermobile vet.    Works for [employer], travels a lot, is in the car a lot. Just transferred to [employer], doing ME and will graduate this year.   He does not take vayarin regularly, gets from pharmacy, takes as needed.  He has a history of testosterone deficiency, it not on anything, states it makes him irritable.  Lab Results  Component Value Date   TESTOSTERONE 238 (L) 09/22/2018   Occ lower back pain, has seen chiropractor and has done at home stretches, will having back issues, will have occ bilateral tingling lateral legs, very rare, will only go down to mid hip. Ibuprofen, tylenol can help.  Takes tramadol. occ for back, last refill was 11/2017 BMI is There is no height or weight on file to calculate BMI., he is working on diet and exercise. Wt Readings from Last 3 Encounters:  07/19/19 189 lb (85.7 kg)  06/01/19 187 lb 3.2 oz (84.9 kg)  03/23/19 198 lb (89.8 kg)    Current Medications:  Current Outpatient Medications on File Prior to Visit  Medication Sig Dispense Refill  . HYDROcodone-acetaminophen (NORCO/VICODIN) 5-325 MG tablet Take 1-2 tablets by  mouth every 6 (six) hours as needed for moderate pain. 10 tablet 0  . meloxicam (MOBIC) 15 MG tablet Take 1/2 to 1 tablet Daily with Food for Pain & Inflammation 90 tablet 1  . methocarbamol (ROBAXIN) 500 MG tablet Take 1 tablet (500 mg total) by mouth 2 (two) times daily as needed for muscle spasms. 60 tablet 2  . nabumetone (RELAFEN) 750 MG tablet Take 1 tablet (750 mg total) by mouth 2 (two) times daily as needed. 60 tablet 6  . predniSONE (DELTASONE) 10 MG tablet Take as directed for 12 days.  Daily dose 6,6,5,5,4,4,3,3,2,2,1,1. (Patient not taking: Reported on 08/18/2019) 42 tablet 0  . scopolamine (TRANSDERM-SCOP, 1.5 MG,) 1 MG/3DAYS Place 1 patch (1.5 mg total) onto the skin every 3  (three) days. (Patient taking differently: Place 1 patch onto the skin as needed. ) 4 patch 0  . traMADol (ULTRAM) 50 MG tablet Take 1 tab every 6 hours as needed for severe pain. Do not take with other sedating medications or alcohol. Do not take while driving. (Patient not taking: Reported on 08/18/2019) 15 tablet 0  . Vitamin D, Ergocalciferol, (DRISDOL) 1.25 MG (50000 UT) CAPS capsule TAKE 1 CAPSULE BY MOUTH 2 DAYS PER WEEK 30 capsule 1   No current facility-administered medications on file prior to visit.   Health Maintenance:   Immunization History  Administered Date(s) Administered  . Influenza Inj Mdck Quad With Preservative 07/19/2019    TD/TDAP: 2015 Influenza: 2015 Pneumovax: N/A Prevnar 13: N/A  Sexually Active: yes, wife  Last Dental Exam: Dr. Ninetta Lights, q 6 months Last Eye Exam: None  Medical History:  Past Medical History:  Diagnosis Date  . Hypertension    Allergies Allergies  Allergen Reactions  . Flexeril [Cyclobenzaprine]     SURGICAL HISTORY He  has no past surgical history on file.   FAMILY HISTORY His family history includes Diabetes in his paternal grandfather; Heart disease (age of onset: 51) in his father; Hyperlipidemia in his father; Hypertension in his father.   SOCIAL HISTORY He  reports that he has never smoked. He has never used smokeless tobacco. He reports current alcohol use of about 14.0 standard drinks of alcohol per week. He reports that he does not use drugs.  Review of Systems: Review of Systems  Constitutional: Positive for malaise/fatigue. Negative for chills, diaphoresis, fever and weight loss.  HENT: Negative for congestion, ear discharge, ear pain, hearing loss, nosebleeds, sore throat and tinnitus.   Eyes: Negative.   Respiratory: Negative.  Negative for stridor.   Cardiovascular: Negative.   Gastrointestinal: Positive for heartburn. Negative for abdominal pain, blood in stool, constipation, diarrhea, melena, nausea and  vomiting.  Genitourinary: Negative.   Musculoskeletal: Positive for back pain. Negative for falls, joint pain, myalgias and neck pain.  Skin: Negative.   Neurological: Negative for dizziness, tingling, tremors, sensory change, speech change, focal weakness, seizures, loss of consciousness, weakness and headaches (has TMJ, needs mouth guard).  Endo/Heme/Allergies: Negative.   Psychiatric/Behavioral: Negative for depression, hallucinations, memory loss, substance abuse and suicidal ideas. The patient has insomnia. The patient is not nervous/anxious.    Physical Exam: Estimated body mass index is 27.51 kg/m as calculated from the following:   Height as of 07/19/19: 5' 9.5" (1.765 m).   Weight as of 07/19/19: 189 lb (85.7 kg). There were no vitals taken for this visit. General Appearance: Well nourished, in no apparent distress.  Eyes: PERRLA, EOMs, conjunctiva no swelling or erythema, normal fundi and vessels.  Sinuses:  No Frontal/maxillary tenderness  ENT/Mouth: Ext aud canals clear, normal light reflex with TMs without erythema, bulging. Good dentition. No erythema, swelling, or exudate on post pharynx. Tonsils not swollen or erythematous. Hearing normal.  Neck: Supple, thyroid normal. No bruits  Respiratory: Respiratory effort normal, BS equal bilaterally without rales, rhonchi, wheezing or stridor.  Cardio: RRR without murmurs, rubs or gallops. Brisk peripheral pulses without edema.  Chest: symmetric, with normal excursions and percussion.  Abdomen: Soft, nontender, no guarding, rebound, hernias, masses, or organomegaly.  Lymphatics: Non tender without lymphadenopathy.  Genitourinary: defer Musculoskeletal: Full ROM all peripheral extremities,5/5 strength, and normal gait. Negative straight leg.   Skin: Warm, dry without rashes, lesions, ecchymosis. Neuro: Cranial nerves intact, reflexes equal bilaterally. Normal muscle tone, no cerebellar symptoms. Sensation intact.  Psych: Awake and  oriented X 3, normal affect, Insight and Judgment appropriate.   EKG: WNL, IRBBB  Vicie Mutters 1:39 PM [facility]

## 2019-09-25 ENCOUNTER — Encounter: Payer: Self-pay | Admitting: Physician Assistant

## 2019-10-03 DIAGNOSIS — F4320 Adjustment disorder, unspecified: Secondary | ICD-10-CM | POA: Diagnosis not present

## 2019-10-19 DIAGNOSIS — F4320 Adjustment disorder, unspecified: Secondary | ICD-10-CM | POA: Diagnosis not present

## 2019-11-20 ENCOUNTER — Other Ambulatory Visit: Payer: Self-pay

## 2019-11-20 ENCOUNTER — Encounter: Payer: Self-pay | Admitting: Family Medicine

## 2019-11-20 ENCOUNTER — Ambulatory Visit: Payer: BC Managed Care – PPO | Admitting: Family Medicine

## 2019-11-20 DIAGNOSIS — M79644 Pain in right finger(s): Secondary | ICD-10-CM | POA: Diagnosis not present

## 2019-11-20 DIAGNOSIS — M79645 Pain in left finger(s): Secondary | ICD-10-CM

## 2019-11-20 DIAGNOSIS — G8929 Other chronic pain: Secondary | ICD-10-CM

## 2019-11-20 MED ORDER — DICLOFENAC SODIUM 1 % EX GEL: 4.0000 g | Freq: Four times a day (QID) | CUTANEOUS | 6 refills | Status: DC | PRN

## 2019-11-20 NOTE — Patient Instructions (Signed)
   Possible right 3rd trigger finger:  - Splint the DIP joint straight for much of the day, for about a week. - Ice applications to palm of hand a couple times daily. - Voltaren gel to palm side of hand.  (Can use for left thumb as well) - Referral to occupational therapy, or else a cortisone injection, if persists.

## 2019-11-20 NOTE — Progress Notes (Signed)
Office Visit Note   Patient: Dale Conley           Date of Birth: 04/02/1976           MRN: [id number] Visit Date: 11/20/2019 Requested by: Unk Pinto, [address] [place],  [place] [address] PCP: Unk Pinto, MD  Subjective: Chief Complaint  Patient presents with  . Right Middle Finger - Pain    Pain and swelling x 1 month. Feels like something "pinches" in the finger when he picks up objects. Right-hand dominant.  . Left Thumb - Pain    Intermittent pain x 1 month. Hurts mostly after gripping a steering wheel for a long period of time.    HPI: He is here with right third finger and left thumb pain.  Both areas started bothering him about a month ago, no injury.  His right third finger bothers him the most.  When he does a lot of activity, he feels like something is "pinching" and he has to "pop his knuckle" to make the pain better.  Denies any numbness or tingling.  His left thumb hurts primarily at the MCP joint.  It actually seems to be getting better in the past week or so.              ROS:   All other systems were reviewed and are negative.  Objective: Vital Signs: There were no vitals taken for this visit.  Physical Exam:  General:  Alert and oriented, in no acute distress. Pulm:  Breathing unlabored. Psy:  Normal mood, congruent affect.  Right hand: There is slight swelling around the third MCP joint.  He is tender to palpation at the A1 pulley and has pain when he actively flexes his finger but it does not trigger in flexion.  There is no significant tenderness around the PIP joint. Left thumb: He is tender near the MCP joint but he has good range of motion and no swelling today.  No ligamentous laxity.  Imaging: None today  Assessment & Plan: 1.  Right third finger pain, possible trigger finger -Dorsal went, ice applications to the palm of the hand, Voltaren gel.  If symptoms persist could consider cortisone injection or occupational  therapy referral.  2.  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     Procedures: No procedures performed  No notes on file     PMFS History: Patient Active Problem List   Diagnosis Date Noted  . Elevated LFTs 03/24/2019  . Overweight (BMI 25.0-29.9) 03/23/2019  . Hyperlipidemia 03/23/2019  . Headache 09/22/2018  . Obstructive sleep apnea syndrome 09/16/2017  . Essential hypertension 05/04/2016  . Vitamin D deficiency 05/04/2016  . Medication management 05/04/2016  . Hypogonadism in male 08/14/2015  . Back pain 11/27/2014   Past Medical History:  Diagnosis Date  . Hypertension     Family History  Problem Relation Age of Onset  . Heart disease Father 2       CABG  . Hypertension Father   . Hyperlipidemia Father   . Diabetes Paternal Grandfather     History reviewed. No pertinent surgical history. Social History   Occupational History  . Not on file  Tobacco Use  . Smoking status: Never Smoker  . Smokeless tobacco: Never Used  Substance and Sexual Activity  . Alcohol use: Yes    Alcohol/week: 14.0 standard drinks    Types: 14 Standard drinks or equivalent per week  Comment: beer  . Drug use: No  . Sexual activity: Yes

## 2019-11-22 ENCOUNTER — Telehealth: Payer: Self-pay | Admitting: Family Medicine

## 2019-11-22 NOTE — Telephone Encounter (Signed)
Patient called requesting generic brand of diclosenac medication. Patient states insurance will not pay for medication. Please give patient call back. Patient phone number is [phone].

## 2019-11-22 NOTE — Telephone Encounter (Signed)
I called the patient back: the prior authorization has been sent to his insurance company, but no response has been received yet. I advised him that diclofenac is the generic for voltaren gel. This does come otc, as well now, in both brand and generic. He can either wait until we get the response from the insurance company or get a small tube otc so he can go ahead and start the treatment. I will advise him and the pharmacy, once I have received a response on medication coverage.

## 2019-11-23 ENCOUNTER — Telehealth: Payer: Self-pay | Admitting: Family Medicine

## 2019-11-23 NOTE — Telephone Encounter (Signed)
I called and advised both the patient and his pharmacy. The patient says his wife had some Voltaren gel at home already, and has tried this - he says it does help.

## 2019-11-23 NOTE — Telephone Encounter (Signed)
Dale Conley with [employer] called informing us that the pt was denied for a sodium type of gel. Dale Conley also stated she faxed over a denial as well.   [phone]

## 2019-11-27 DIAGNOSIS — M180 Bilateral primary osteoarthritis of first carpometacarpal joints: Secondary | ICD-10-CM | POA: Diagnosis not present

## 2019-11-27 DIAGNOSIS — M79641 Pain in right hand: Secondary | ICD-10-CM | POA: Diagnosis not present

## 2019-11-27 DIAGNOSIS — M79642 Pain in left hand: Secondary | ICD-10-CM | POA: Diagnosis not present

## 2019-11-27 DIAGNOSIS — M65331 Trigger finger, right middle finger: Secondary | ICD-10-CM | POA: Diagnosis not present

## 2019-11-29 DIAGNOSIS — M1812 Unilateral primary osteoarthritis of first carpometacarpal joint, left hand: Secondary | ICD-10-CM | POA: Diagnosis not present

## 2019-11-29 DIAGNOSIS — M79645 Pain in left finger(s): Secondary | ICD-10-CM | POA: Diagnosis not present

## 2019-11-29 DIAGNOSIS — M180 Bilateral primary osteoarthritis of first carpometacarpal joints: Secondary | ICD-10-CM | POA: Diagnosis not present

## 2019-12-27 DIAGNOSIS — M65331 Trigger finger, right middle finger: Secondary | ICD-10-CM | POA: Diagnosis not present

## 2019-12-27 DIAGNOSIS — M180 Bilateral primary osteoarthritis of first carpometacarpal joints: Secondary | ICD-10-CM | POA: Diagnosis not present

## 2020-02-13 ENCOUNTER — Ambulatory Visit: Payer: BC Managed Care – PPO | Admitting: Family Medicine

## 2020-02-13 ENCOUNTER — Other Ambulatory Visit: Payer: Self-pay

## 2020-02-13 ENCOUNTER — Ambulatory Visit: Payer: Self-pay

## 2020-02-13 ENCOUNTER — Encounter: Payer: Self-pay | Admitting: Family Medicine

## 2020-02-13 DIAGNOSIS — M25511 Pain in right shoulder: Secondary | ICD-10-CM

## 2020-02-13 NOTE — Progress Notes (Signed)
   Office Visit Note   Patient: Dale Conley           Date of Birth: 1976/05/11           MRN: [id number] Visit Date: 02/13/2020 Requested by: Lucky Cowboy, MD [address] [place],  [place] [address] PCP: Lucky Cowboy, MD  Subjective: Chief Complaint  Patient presents with  . Right Shoulder - Pain    Pain on top of the shoulder. Worse after playing golf 2 days ago.    HPI: He is here with recurrent right shoulder pain.  Injection in October gave excellent relief at the [facility] joint until about a week ago.  It got worse after playing golf 2 days ago.  Pain on top of the shoulder, hurts with any movement.              ROS:   All other systems were reviewed and are negative.  Objective: Vital Signs: There were no vitals taken for this visit.  Physical Exam:  General:  Alert and oriented, in no acute distress. Pulm:  Breathing unlabored. Psy:  Normal mood, congruent affect. Skin: No rash Right shoulder: Point tender at the [facility] joint.  No adhesive capsulitis.  Pain with AC crossover test.  Imaging: US Guided Needle Placement  Result Date: 02/13/2020 Right AC injection:  After sterile prep with betadine, injected 4 cc 1% lidocaine without epi and 40 mg methylprednisolone using ultrasound to guide needle placement.  Injectate seen filling joint capsule.   Assessment & Plan: 1.  Right shoulder AC joint arthropathy -Discussed options with him and elected to inject again under ultrasound guidance.  He will follow-up as needed.     Procedures: No procedures performed  No notes on file     PMFS History: Patient Active Problem List   Diagnosis Date Noted  . Elevated LFTs 03/24/2019  . Overweight (BMI 25.0-29.9) 03/23/2019  . Hyperlipidemia 03/23/2019  . Headache 09/22/2018  . Obstructive sleep apnea syndrome 09/16/2017  . Essential hypertension 05/04/2016  . Vitamin D deficiency 05/04/2016  . Medication management 05/04/2016  . Hypogonadism in male  08/14/2015  . Back pain 11/27/2014   Past Medical History:  Diagnosis Date  . Hypertension     Family History  Problem Relation Age of Onset  . Heart disease Father 49       CABG  . Hypertension Father   . Hyperlipidemia Father   . Diabetes Paternal Grandfather     History reviewed. No pertinent surgical history. Social History   Occupational History  . Not on file  Tobacco Use  . Smoking status: Never Smoker  . Smokeless tobacco: Never Used  Substance and Sexual Activity  . Alcohol use: Yes    Alcohol/week: 14.0 standard drinks    Types: 14 Standard drinks or equivalent per week    Comment: beer  . Drug use: No  . Sexual activity: Yes

## 2020-02-14 DIAGNOSIS — M180 Bilateral primary osteoarthritis of first carpometacarpal joints: Secondary | ICD-10-CM | POA: Diagnosis not present

## 2020-02-14 DIAGNOSIS — M65331 Trigger finger, right middle finger: Secondary | ICD-10-CM | POA: Diagnosis not present

## 2020-03-14 ENCOUNTER — Other Ambulatory Visit: Payer: Self-pay | Admitting: Adult Health

## 2020-03-18 ENCOUNTER — Other Ambulatory Visit: Payer: Self-pay | Admitting: Orthopedic Surgery

## 2020-03-18 DIAGNOSIS — M65331 Trigger finger, right middle finger: Secondary | ICD-10-CM | POA: Diagnosis not present

## 2020-03-19 ENCOUNTER — Other Ambulatory Visit: Payer: Self-pay | Admitting: Orthopedic Surgery

## 2020-04-03 ENCOUNTER — Encounter (HOSPITAL_BASED_OUTPATIENT_CLINIC_OR_DEPARTMENT_OTHER): Payer: Self-pay | Admitting: Orthopedic Surgery

## 2020-04-03 ENCOUNTER — Other Ambulatory Visit: Payer: Self-pay

## 2020-04-08 ENCOUNTER — Other Ambulatory Visit (HOSPITAL_COMMUNITY)
Admission: RE | Admit: 2020-04-08 | Discharge: 2020-04-08 | Disposition: A | Discharge status: Home / Self Care | Payer: BC Managed Care – PPO | Source: Ambulatory Visit | Attending: Orthopedic Surgery | Admitting: Orthopedic Surgery

## 2020-04-08 DIAGNOSIS — Z01812 Encounter for preprocedural laboratory examination: Secondary | ICD-10-CM | POA: Diagnosis not present

## 2020-04-08 DIAGNOSIS — Z20822 Contact with and (suspected) exposure to covid-19: Secondary | ICD-10-CM | POA: Insufficient documentation

## 2020-04-08 LAB — SARS CORONAVIRUS 2 (TAT 6-24 HRS): SARS Coronavirus 2: NEGATIVE

## 2020-04-10 NOTE — Progress Notes (Signed)

## 2020-04-11 ENCOUNTER — Encounter (HOSPITAL_BASED_OUTPATIENT_CLINIC_OR_DEPARTMENT_OTHER)
Admission: RE | Disposition: A | Discharge status: Home / Self Care | Payer: Self-pay | Source: Home / Self Care | Attending: Orthopedic Surgery

## 2020-04-11 ENCOUNTER — Ambulatory Visit (HOSPITAL_BASED_OUTPATIENT_CLINIC_OR_DEPARTMENT_OTHER): Payer: BC Managed Care – PPO | Admitting: Anesthesiology

## 2020-04-11 ENCOUNTER — Encounter (HOSPITAL_BASED_OUTPATIENT_CLINIC_OR_DEPARTMENT_OTHER): Payer: Self-pay | Admitting: Orthopedic Surgery

## 2020-04-11 ENCOUNTER — Ambulatory Visit (HOSPITAL_BASED_OUTPATIENT_CLINIC_OR_DEPARTMENT_OTHER)
Admission: RE | Admit: 2020-04-11 | Discharge: 2020-04-11 | Disposition: A | Discharge status: Home / Self Care | Payer: BC Managed Care – PPO | Attending: Orthopedic Surgery | Admitting: Orthopedic Surgery

## 2020-04-11 ENCOUNTER — Other Ambulatory Visit: Payer: Self-pay

## 2020-04-11 DIAGNOSIS — G4730 Sleep apnea, unspecified: Secondary | ICD-10-CM | POA: Diagnosis not present

## 2020-04-11 DIAGNOSIS — M65841 Other synovitis and tenosynovitis, right hand: Secondary | ICD-10-CM | POA: Insufficient documentation

## 2020-04-11 DIAGNOSIS — M65331 Trigger finger, right middle finger: Secondary | ICD-10-CM | POA: Insufficient documentation

## 2020-04-11 DIAGNOSIS — I10 Essential (primary) hypertension: Secondary | ICD-10-CM | POA: Diagnosis not present

## 2020-04-11 DIAGNOSIS — M659 Synovitis and tenosynovitis, unspecified: Secondary | ICD-10-CM | POA: Diagnosis not present

## 2020-04-11 DIAGNOSIS — Z8261 Family history of arthritis: Secondary | ICD-10-CM | POA: Diagnosis not present

## 2020-04-11 DIAGNOSIS — E785 Hyperlipidemia, unspecified: Secondary | ICD-10-CM | POA: Diagnosis not present

## 2020-04-11 HISTORY — PX: TRIGGER FINGER RELEASE: SHX641

## 2020-04-11 HISTORY — DX: Sleep apnea, unspecified: G47.30

## 2020-04-11 SURGERY — RELEASE, A1 PULLEY, FOR TRIGGER FINGER
Anesthesia: Regional | Site: Middle Finger | Laterality: Right

## 2020-04-11 MED ORDER — PROPOFOL 500 MG/50ML IV EMUL
INTRAVENOUS | Status: DC | PRN
  Administered 2020-04-11: 100 ug/kg/min via INTRAVENOUS

## 2020-04-11 MED ORDER — BUPIVACAINE HCL (PF) 0.25 % IJ SOLN
INTRAMUSCULAR | Status: DC | PRN
  Administered 2020-04-11: 7 mL

## 2020-04-11 MED ORDER — HYDROMORPHONE HCL 1 MG/ML IJ SOLN: 0.2500 mg | INTRAMUSCULAR | Status: DC | PRN

## 2020-04-11 MED ORDER — CEFAZOLIN SODIUM-DEXTROSE 2-4 GM/100ML-% IV SOLN
INTRAVENOUS | Status: AC
  Filled 2020-04-11: qty 100

## 2020-04-11 MED ORDER — PROPOFOL 10 MG/ML IV BOLUS
INTRAVENOUS | Status: DC | PRN
  Administered 2020-04-11: 50 mg via INTRAVENOUS

## 2020-04-11 MED ORDER — MIDAZOLAM HCL 5 MG/5ML IJ SOLN
INTRAMUSCULAR | Status: DC | PRN
  Administered 2020-04-11: 2 mg via INTRAVENOUS

## 2020-04-11 MED ORDER — CEFAZOLIN SODIUM-DEXTROSE 2-4 GM/100ML-% IV SOLN
2.0000 g | INTRAVENOUS | Status: AC
  Administered 2020-04-11: 2 g via INTRAVENOUS

## 2020-04-11 MED ORDER — PROMETHAZINE HCL 25 MG/ML IJ SOLN: 6.2500 mg | INTRAMUSCULAR | Status: DC | PRN

## 2020-04-11 MED ORDER — OXYCODONE HCL 5 MG/5ML PO SOLN: 5.0000 mg | Freq: Once | ORAL | Status: DC | PRN

## 2020-04-11 MED ORDER — FENTANYL CITRATE (PF) 100 MCG/2ML IJ SOLN
INTRAMUSCULAR | Status: AC
  Filled 2020-04-11: qty 2

## 2020-04-11 MED ORDER — FENTANYL CITRATE (PF) 100 MCG/2ML IJ SOLN
INTRAMUSCULAR | Status: DC | PRN
  Administered 2020-04-11: 100 ug via INTRAVENOUS

## 2020-04-11 MED ORDER — ONDANSETRON HCL 4 MG/2ML IJ SOLN
INTRAMUSCULAR | Status: AC
  Filled 2020-04-11: qty 2

## 2020-04-11 MED ORDER — LACTATED RINGERS IV SOLN: INTRAVENOUS | Status: DC

## 2020-04-11 MED ORDER — PROPOFOL 10 MG/ML IV BOLUS
INTRAVENOUS | Status: AC
  Filled 2020-04-11: qty 20

## 2020-04-11 MED ORDER — LIDOCAINE HCL (PF) 0.5 % IJ SOLN
INTRAMUSCULAR | Status: DC | PRN
  Administered 2020-04-11: 30 mL via INTRAVENOUS

## 2020-04-11 MED ORDER — TRAMADOL HCL 50 MG PO TABS: 50.0000 mg | ORAL_TABLET | Freq: Four times a day (QID) | ORAL | 0 refills | Status: DC | PRN

## 2020-04-11 MED ORDER — OXYCODONE HCL 5 MG PO TABS: 5.0000 mg | ORAL_TABLET | Freq: Once | ORAL | Status: DC | PRN

## 2020-04-11 MED ORDER — MIDAZOLAM HCL 2 MG/2ML IJ SOLN
INTRAMUSCULAR | Status: AC
  Filled 2020-04-11: qty 2

## 2020-04-11 SURGICAL SUPPLY — 30 items
BLADE SURG 15 STRL LF DISP TIS (BLADE) ×1 IMPLANT
BLADE SURG 15 STRL SS (BLADE) ×1
BNDG COHESIVE 2X5 TAN STRL LF (GAUZE/BANDAGES/DRESSINGS) ×2 IMPLANT
BNDG ESMARK 4X9 LF (GAUZE/BANDAGES/DRESSINGS) IMPLANT
CHLORAPREP W/TINT 26 (MISCELLANEOUS) ×2 IMPLANT
CORD BIPOLAR FORCEPS 12FT (ELECTRODE) IMPLANT
COVER BACK TABLE 60X90IN (DRAPES) ×2 IMPLANT
COVER MAYO STAND STRL (DRAPES) ×2 IMPLANT
COVER WAND RF STERILE (DRAPES) IMPLANT
CUFF TOURN SGL QUICK 18X4 (TOURNIQUET CUFF) IMPLANT
DECANTER SPIKE VIAL GLASS SM (MISCELLANEOUS) IMPLANT
DRAPE EXTREMITY T 121X128X90 (DISPOSABLE) ×2 IMPLANT
DRAPE SURG 17X23 STRL (DRAPES) ×2 IMPLANT
GAUZE SPONGE 4X4 12PLY STRL (GAUZE/BANDAGES/DRESSINGS) ×2 IMPLANT
GAUZE XEROFORM 1X8 LF (GAUZE/BANDAGES/DRESSINGS) ×2 IMPLANT
GLOVE BIOGEL PI IND STRL 8.5 (GLOVE) ×1 IMPLANT
GLOVE BIOGEL PI INDICATOR 8.5 (GLOVE) ×1
GLOVE SURG ORTHO 8.0 STRL STRW (GLOVE) ×2 IMPLANT
GOWN STRL REUS W/ TWL LRG LVL3 (GOWN DISPOSABLE) ×1 IMPLANT
GOWN STRL REUS W/TWL LRG LVL3 (GOWN DISPOSABLE) ×1
GOWN STRL REUS W/TWL XL LVL3 (GOWN DISPOSABLE) ×2 IMPLANT
NEEDLE PRECISIONGLIDE 27X1.5 (NEEDLE) ×2 IMPLANT
NS IRRIG 1000ML POUR BTL (IV SOLUTION) ×2 IMPLANT
PACK BASIN DAY SURGERY FS (CUSTOM PROCEDURE TRAY) ×2 IMPLANT
STOCKINETTE 4X48 STRL (DRAPES) ×2 IMPLANT
SUT ETHILON 4 0 PS 2 18 (SUTURE) ×2 IMPLANT
SYR BULB EAR ULCER 3OZ GRN STR (SYRINGE) ×2 IMPLANT
SYR CONTROL 10ML LL (SYRINGE) ×2 IMPLANT
TOWEL GREEN STERILE FF (TOWEL DISPOSABLE) ×4 IMPLANT
UNDERPAD 30X36 HEAVY ABSORB (UNDERPADS AND DIAPERS) ×2 IMPLANT

## 2020-04-11 NOTE — Discharge Instructions (Addendum)

## 2020-04-11 NOTE — Transfer of Care (Signed)
Immediate Anesthesia Transfer of Care Note  Patient: Dale Conley  Procedure(s) Performed: RELEASE TRIGGER FINGER/A-1 PULLEY RIGHT MIDDLE FINGER (Right Middle Finger)  Patient Location: PACU  Anesthesia Type:MAC and Bier block  Level of Consciousness: awake, alert  and oriented  Airway & Oxygen Therapy: Patient Spontanous Breathing  Post-op Assessment: Report given to RN and Post -op Vital signs reviewed and stable  Post vital signs: Reviewed and stable  Last Vitals:  Vitals Value Taken Time  BP 112/74 04/11/20 1122  Temp    Pulse 65 04/11/20 1124  Resp 16 04/11/20 1124  SpO2 99 % 04/11/20 1124  Vitals shown include unvalidated device data.  Last Pain:  Vitals:   04/11/20 0830  TempSrc: Oral  PainSc: 6       Patients Stated Pain Goal: 5 (24/58/09 9833)  Complications: No complications documented.

## 2020-04-11 NOTE — Anesthesia Postprocedure Evaluation (Signed)
Anesthesia Post Note  Patient: [name]  Procedure(s) Performed: RELEASE TRIGGER FINGER/A-1 PULLEY RIGHT MIDDLE FINGER (Right Middle Finger)     Patient location during evaluation: PACU Anesthesia Type: Bier Block Level of consciousness: awake and alert Pain management: pain level controlled Vital Signs Assessment: post-procedure vital signs reviewed and stable Respiratory status: spontaneous breathing, nonlabored ventilation and respiratory function stable Cardiovascular status: blood pressure returned to baseline and stable Postop Assessment: no apparent nausea or vomiting Anesthetic complications: no   No complications documented.  Last Vitals:  Vitals:   04/11/20 1141 04/11/20 1205  BP: 109/85 119/80  Pulse: (!) 59 60  Resp: 13 20  Temp: (!) 36.3 C (!) 36.4 C  SpO2: 99% 100%    Last Pain:  Vitals:   04/11/20 1205  TempSrc:   PainSc: 0-No pain                 Lowella Curb

## 2020-04-11 NOTE — Anesthesia Procedure Notes (Deleted)
Performed by: Constantinos Krempasky C, CRNA       

## 2020-04-11 NOTE — H&P (Signed)
  Dale Conley is an 43 y.o. male.   Chief Complaint: catching right middle fingerHPI: Dale Conley is a 44 year old right-hand-dominant male who comes in with a complaint of catching of his right middle finger which has been going on for months. He has been placed on meloxicam for this. And he is also had a topical which has not helped. He has no history of injury. He states it is worse in the morning he does not have to use his other hand to straighten it out pain is mild mild to moderate in nature. Is also complaining of pain in his left thumb. Is been going on for considerable period of time. He states that meloxicam does not help. He has tried the topical nonsteroidal anti-inflammatory which is giving him only temporary relief. He states that his right middle finger gradually improves during the day. Gripping pinching seem to aggravate this for him. Is no history of diabetes thyroid problems arthritis gout. Family history is positive arthritis negative for the remainder. He has  had a second injection to the trigger middle finger. This continues to trigger for him. He is not complaining of his thumb at the present time.   Past Medical History:  Diagnosis Date  . Sleep apnea     History reviewed. No pertinent surgical history.  Family History  Problem Relation Age of Onset  . Heart disease Father 26       CABG  . Hypertension Father   . Hyperlipidemia Father   . Diabetes Paternal Grandfather    Social History:  reports that he has never smoked. He has never used smokeless tobacco. He reports current alcohol use of about 14.0 standard drinks of alcohol per week. He reports that he does not use drugs.  Allergies:  Allergies  Allergen Reactions  . Flexeril [Cyclobenzaprine]     No medications prior to admission.    No results found for this or any previous visit (from the past 48 hour(s)).  No results found.   Pertinent items are noted in HPI.  Height 5\' 10"  (1.778 m), weight 88.5  kg.  General appearance: alert, cooperative and appears stated age Head: Normocephalic, without obvious abnormality Neck: no JVD Resp: clear to auscultation bilaterally Cardio: regular rate and rhythm, S1, S2 normal, no murmur, click, rub or gallop GI: soft, non-tender; bowel sounds normal; no masses,  no organomegaly Extremities: trigger right middle Pulses: 2+ and symmetric Skin: Skin color, texture, turgor normal. No rashes or lesions Neurologic: Grossly normal Incision/Wound: na  Assessment/Plan  Assessment:  1. Trigger middle finger of right hand    Plan: He is scheduled for release A1 pulley right middle finger. Preperi-and postoperative course been discussed along with risk and complications. He is aware there is no guarantee to the surgery the possibility of infection recurrence injury to arteries nerves tendons incomplete relief symptoms and dystrophy. This scheduled as an outpatient for release A1 pulley right middle finger under regional anesthesia.   04/11/2020, 5:49 AM

## 2020-04-11 NOTE — Anesthesia Preprocedure Evaluation (Signed)
Anesthesia Evaluation  Patient identified by MRN, date of birth, ID band Patient awake    Reviewed: Allergy & Precautions, NPO status , Patient's Chart, lab work & pertinent test results  Airway Mallampati: II  TM Distance: >3 FB Neck ROM: Full    Dental no notable dental hx.    Pulmonary sleep apnea ,    Pulmonary exam normal breath sounds clear to auscultation       Cardiovascular hypertension, Normal cardiovascular exam Rhythm:Regular Rate:Normal     Neuro/Psych  Headaches, negative psych ROS   GI/Hepatic negative GI ROS, Neg liver ROS,   Endo/Other  negative endocrine ROS  Renal/GU negative Renal ROS  negative genitourinary   Musculoskeletal negative musculoskeletal ROS (+)   Abdominal   Peds negative pediatric ROS (+)  Hematology negative hematology ROS (+)   Anesthesia Other Findings   Reproductive/Obstetrics negative OB ROS                             Anesthesia Physical Anesthesia Plan  ASA: II  Anesthesia Plan: Bier Block and Bier Block-LIDOCAINE ONLY   Post-op Pain Management:    Induction: Intravenous  PONV Risk Score and Plan: 1 and Ondansetron and Treatment may vary due to age or medical condition  Airway Management Planned: Simple Face Mask  Additional Equipment:   Intra-op Plan:   Post-operative Plan:   Informed Consent: I have reviewed the patients History and Physical, chart, labs and discussed the procedure including the risks, benefits and alternatives for the proposed anesthesia with the patient or authorized representative who has indicated his/her understanding and acceptance.     Dental advisory given  Plan Discussed with: CRNA  Anesthesia Plan Comments:         Anesthesia Quick Evaluation

## 2020-04-11 NOTE — Brief Op Note (Signed)
04/11/2020  11:18 AM  PATIENT:  Dale Conley  44 y.o. male  PRE-OPERATIVE DIAGNOSIS:  TRIGGER RIGHT MIDDLE FINGER  POST-OPERATIVE DIAGNOSIS:  TRIGGER RIGHT MIDDLE FINGER  PROCEDURE:  Procedure(s) with comments: RELEASE TRIGGER FINGER/A-1 PULLEY RIGHT MIDDLE FINGER (Right) - IV REGIONAL FOREARM BLOCK  SURGEON:  Surgeon(s) and Role:    * Cindee Salt, MD - Primary  PHYSICIAN ASSISTANT:   ASSISTANTS: none   ANESTHESIA:   local, regional and IV sedation  EBL: 32ml  BLOOD ADMINISTERED:none  DRAINS: none   LOCAL MEDICATIONS USED:  BUPIVICAINE   SPECIMEN:  No Specimen  DISPOSITION OF SPECIMEN:  N/A  COUNTS:  YES  TOURNIQUET:   Total Tourniquet Time Documented: Forearm (Right) - 15 minutes Total: Forearm (Right) - 15 minutes   DICTATION: .Dragon Dictation  PLAN OF CARE: Discharge to home after PACU  PATIENT DISPOSITION:  PACU - hemodynamically stable.

## 2020-04-11 NOTE — Op Note (Signed)
NAME: Dale Conley MEDICAL RECORD NO: [id number] DATE OF BIRTH: 06-08-1976 FACILITY: [facility] LOCATION: [facility] PHYSICIAN: Nicki Reaper, MD   OPERATIVE REPORT   DATE OF PROCEDURE: 04/11/20    PREOPERATIVE DIAGNOSIS:   Stenosing tenosynovitis right middle finger   POSTOPERATIVE DIAGNOSIS:   Same   PROCEDURE:   Release A1 pulley right middle finger   SURGEON: Cindee Salt, M.D.   ASSISTANT: none   ANESTHESIA:  Bier block with sedation and Local   INTRAVENOUS FLUIDS:  Per anesthesia flow sheet.   ESTIMATED BLOOD LOSS:  Minimal.   COMPLICATIONS:  None.   SPECIMENS:  none   TOURNIQUET TIME:    Total Tourniquet Time Documented: Forearm (Right) - 15 minutes Total: Forearm (Right) - 15 minutes    DISPOSITION:  Stable to PACU.   INDICATIONS: Patient is a 44 year old man with a history of triggering of his right middle finger.  This not responded to conservative treatment and is elected to undergo surgical release of the A1 pulley.  Preperi-and postoperative course been discussed along with risks and complications.  He is aware that there is no guarantee to the surgery the possibility of infection recurrence injury to arteries nerves tendons incomplete relief of symptoms and dystrophy.  Preoperative area the patient is seen the extremity marked by both patient and surgeon antibiotic given.  OPERATIVE COURSE: Patient brought to the operating room placed in the supine position a forearm IV regional was carried out without difficulty under the direction the anesthesia department.  He was prepped using ChloraPrep a 3-minute dry time allowed and a timeout taken to confirm patient procedure.  After adequate anesthesia was afforded an oblique incision was made over the A1 pulley of the right middle finger carried down through subcutaneous tissue.  Bleeders were electrocauterized necessary with bipolar.  The A1 pulley was identified retractors placed protecting neurovascular  bundles radially and ulnarly an incision was then made on the radial aspect of the A1 pulley this was released along with a small incision centrally and A2 tenosynovial tissue proximally was separated with blunt and sharp dissection.  The 2 tendons were then separated breaking any adhesions.  The pin was placed in full passive range of motion with no further triggering.  The wound was copiously irrigated with saline.  The skin was closed erupted 4-0 nylon sutures.  A sterile compressive dressing with the fingers 3 was applied.  Inflation the tourniquet all fingers immediately pink.  He was taken to the recovery room for observation in satisfactory condition.  He will be discharged home to return to the [facility] in 1 week on Tylenol ibuprofen for pain with tramadol for breakthrough.   Cindee Salt, MD Electronically signed, 04/11/20

## 2020-04-11 NOTE — Anesthesia Procedure Notes (Signed)
Anesthesia Regional Block: Bier block (IV Regional)   Pre-Anesthetic Checklist: ,, timeout performed, Correct Patient, Correct Site, Correct Laterality, Correct Procedure,, site marked, surgical consent,, at surgeon's request  Laterality: Right     Needles:  Injection technique: Single-shot  Needle Type: Other      Needle Gauge: 20     Additional Needles:   Procedures:,,,,, intact distal pulses, Esmarch exsanguination, single tourniquet utilized,  Narrative:  Start time: 04/11/2020 11:01 AM End time: 04/11/2020 11:02 AM  Performed by: Personally

## 2020-04-12 ENCOUNTER — Encounter (HOSPITAL_BASED_OUTPATIENT_CLINIC_OR_DEPARTMENT_OTHER): Payer: Self-pay | Admitting: Orthopedic Surgery

## 2020-04-13 ENCOUNTER — Other Ambulatory Visit: Payer: Self-pay | Admitting: Adult Health

## 2020-04-13 DIAGNOSIS — E559 Vitamin D deficiency, unspecified: Secondary | ICD-10-CM

## 2020-04-14 ENCOUNTER — Other Ambulatory Visit: Payer: Self-pay | Admitting: Internal Medicine

## 2020-04-14 DIAGNOSIS — M25511 Pain in right shoulder: Secondary | ICD-10-CM

## 2020-04-14 MED ORDER — MELOXICAM 15 MG PO TABS: ORAL_TABLET | ORAL | 0 refills | Status: DC

## 2020-04-23 ENCOUNTER — Other Ambulatory Visit: Payer: Self-pay | Admitting: Internal Medicine

## 2020-04-23 DIAGNOSIS — M25511 Pain in right shoulder: Secondary | ICD-10-CM

## 2020-04-23 MED ORDER — MELOXICAM 15 MG PO TABS: ORAL_TABLET | ORAL | 0 refills | Status: DC

## 2020-05-15 ENCOUNTER — Encounter: Payer: Self-pay | Admitting: Adult Health

## 2020-05-15 ENCOUNTER — Ambulatory Visit: Payer: BC Managed Care – PPO | Admitting: Adult Health

## 2020-05-15 ENCOUNTER — Other Ambulatory Visit: Payer: Self-pay

## 2020-05-15 VITALS — BP 120/82 | HR 86 | Temp 96.8°F | Wt 196.0 lb

## 2020-05-15 DIAGNOSIS — J069 Acute upper respiratory infection, unspecified: Secondary | ICD-10-CM | POA: Diagnosis not present

## 2020-05-15 MED ORDER — PROMETHAZINE-DM 6.25-15 MG/5ML PO SYRP
5.0000 mL | ORAL_SOLUTION | Freq: Four times a day (QID) | ORAL | 1 refills | Status: DC | PRN
Start: 2020-05-15 — End: 2020-09-06

## 2020-05-15 MED ORDER — PREDNISONE 20 MG PO TABS
ORAL_TABLET | ORAL | 0 refills | Status: DC
Start: 2020-05-15 — End: 2020-09-06

## 2020-05-15 NOTE — Patient Instructions (Signed)

## 2020-05-15 NOTE — Progress Notes (Signed)
Assessment and Plan:  Dale Conley was seen today for acute visit.  Diagnoses and all orders for this visit:  Viral URI with cough Discussed the importance of avoiding unnecessary antibiotic therapy. Anticipate 5-7 days of mild sx for viral URI Did encourage to get Covid 19 testing and social distance  Suggested symptomatic OTC remedies. Nasal saline spray for congestion. Nasal steroids, allergy pill, oral steroids offered Follow up as needed if sudden rebound sx, new/worsening -     predniSONE (DELTASONE) 20 MG tablet; 2 tablets daily for 3 days, 1 tablet daily for 4 days. -     promethazine-dextromethorphan (PROMETHAZINE-DM) 6.25-15 MG/5ML syrup; Take 5 mLs by mouth 4 (four) times daily as needed for cough.  Further disposition pending results of labs. Discussed med's effects and SE's.   Over 15 minutes of exam, counseling, chart review, and critical decision making was performed.   Future Appointments  Date Time Provider Department Center  09/24/2020  3:00 PM Quentin Mulling, PA-C [facility] None    ------------------------------------------------------------------------------------------------------------------   HPI BP 120/82   Pulse 86   Temp (!) 96.8 F (36 C)   Wt 196 lb (88.9 kg)   SpO2 97%   BMI 28.12 kg/m   44 y.o.male presents for evaluation due to to 5 days of URI. He is concerned due to exposure to infant with RSV [place]   He reports sx began 2-3 days after last known exposure; began with dry scratchy throat, then some dry cough, some mild sinus pressure/nasal congestion, vaguely felt unwell. Sx have been waxing and waning, overall fairly mild.   Denies HA, sinus tenderness, fever/chils, rash, arthralgias or myalgias. The whole family that was there have had similar mild sx.   He has tried taking mucinex, day time cold/flu med, also taking vitamin C and zinc immune booster. Feeling somewhat improved.   Did have covid 19 vaccine,  2/2, pfizer several months ago. A friend who was exposed and has similar sx did get covid 19 tested, was negative. Denies hx of allergies.    Past Medical History:  Diagnosis Date  . Sleep apnea      Allergies  Allergen Reactions  . Flexeril [Cyclobenzaprine] Other (See Comments)    Makes pt groggy    Current Outpatient Medications on File Prior to Visit  Medication Sig  . meloxicam (MOBIC) 15 MG tablet Take 1/2 to 1 tablet Daily with Food for Pain & Inflammation  . methocarbamol (ROBAXIN) 500 MG tablet TAKE 1 TABLET(500 MG) BY MOUTH TWICE DAILY AS NEEDED FOR MUSCLE SPASMS  . traMADol (ULTRAM) 50 MG tablet Take 1 tablet (50 mg total) by mouth every 6 (six) hours as needed.  . Vitamin D, Ergocalciferol, (DRISDOL) 1.25 MG (50000 UNIT) CAPS capsule TAKE 1 CAPSULE BY MOUTH 2 DAYS PER WEEK  . diclofenac Sodium (VOLTAREN) 1 % GEL Apply 4 g topically 4 (four) times daily as needed. (Patient not taking: Reported on 05/15/2020)   No current facility-administered medications on file prior to visit.    ROS: all negative except above.   Physical Exam:  BP 120/82   Pulse 86   Temp (!) 96.8 F (36 C)   Wt 196 lb (88.9 kg)   SpO2 97%   BMI 28.12 kg/m   General Appearance: Well nourished, in no apparent distress. Eyes: PERRLA, EOMs, conjunctiva no swelling or erythema Sinuses: No Frontal/maxillary tenderness ENT/Mouth: Ext aud canals clear, TMs without erythema, bulging. No erythema, swelling, or exudate on post pharynx.  Tonsils not swollen or erythematous. Hearing normal.  Neck: Supple, thyroid normal.  Respiratory: Respiratory effort normal, BS equal bilaterally without rales, rhonchi, wheezing or stridor.  Cardio: RRR with no MRGs. Brisk peripheral pulses without edema.  Abdomen: Soft, + BS.  Non tender, no guarding, rebound, hernias, masses. Lymphatics: Non tender without lymphadenopathy.  Musculoskeletal: Full ROM, 5/5 strength, normal gait.  Skin: Warm, dry without rashes,  lesions, ecchymosis.  Neuro: Cranial nerves intact. Normal muscle tone, no cerebellar symptoms. Sensation intact.  Psych: Awake and oriented X 3, normal affect, Insight and Judgment appropriate.     Dan Maker, NP 10:31 AM [facility]

## 2020-05-28 DIAGNOSIS — M545 Low back pain: Secondary | ICD-10-CM | POA: Diagnosis not present

## 2020-05-28 DIAGNOSIS — M7918 Myalgia, other site: Secondary | ICD-10-CM | POA: Diagnosis not present

## 2020-05-28 DIAGNOSIS — G8929 Other chronic pain: Secondary | ICD-10-CM | POA: Diagnosis not present

## 2020-05-28 DIAGNOSIS — G5702 Lesion of sciatic nerve, left lower limb: Secondary | ICD-10-CM | POA: Diagnosis not present

## 2020-05-31 DIAGNOSIS — M545 Low back pain: Secondary | ICD-10-CM | POA: Diagnosis not present

## 2020-06-05 DIAGNOSIS — J029 Acute pharyngitis, unspecified: Secondary | ICD-10-CM | POA: Diagnosis not present

## 2020-06-11 DIAGNOSIS — G5702 Lesion of sciatic nerve, left lower limb: Secondary | ICD-10-CM | POA: Diagnosis not present

## 2020-06-25 DIAGNOSIS — M5416 Radiculopathy, lumbar region: Secondary | ICD-10-CM | POA: Diagnosis not present

## 2020-06-29 DIAGNOSIS — M545 Low back pain, unspecified: Secondary | ICD-10-CM | POA: Diagnosis not present

## 2020-09-06 ENCOUNTER — Other Ambulatory Visit: Payer: Self-pay

## 2020-09-06 ENCOUNTER — Ambulatory Visit: Payer: Self-pay

## 2020-09-06 ENCOUNTER — Ambulatory Visit (INDEPENDENT_AMBULATORY_CARE_PROVIDER_SITE_OTHER): Payer: BC Managed Care – PPO | Admitting: Family Medicine

## 2020-09-06 DIAGNOSIS — M25511 Pain in right shoulder: Secondary | ICD-10-CM | POA: Diagnosis not present

## 2020-09-06 NOTE — Progress Notes (Signed)
Office Visit Note   Patient: Dale Conley           Date of Birth: 04/26/1976           MRN: [id number] Visit Date: 09/06/2020 Requested by: Lucky Cowboy, MD [address] [place],  [place] [address] PCP: Lucky Cowboy, MD  Subjective: Chief Complaint  Patient presents with  . Right Shoulder - Pain    Pinching/burning pain in the anterior shoulder and into the anterior/outer axilla x several  days - occurs with certain movements only. Tender to touch.Would also like to have the shoulder injected again with cortisone.    HPI: He is here with recurrent right shoulder pain.  Symptoms flared up a few days ago on a consistent basis.  He has had occasional soreness lasting for only a day or 2 in the last couple months, but now he feels ready to get another injection.  In the last couple days he also noticed a hypersensitive area in his axilla.  He is not sure what caused that, no fevers or chills.  He had a Covid booster around November 21.  He developed hives on his shoulder after his first Covid injection and continues to have evidence of those hives on the shoulder.              ROS:   All other systems were reviewed and are negative.  Objective: Vital Signs: There were no vitals taken for this visit.  Physical Exam:  General:  Alert and oriented, in no acute distress. Pulm:  Breathing unlabored. Psy:  Normal mood, congruent affect. Skin: No erythema or warmth. Right shoulder: He is tender over the [facility] joint again, full range of motion.  There is some tenderness in the anterior axillary region, I believe he has an irritated lymph node.   Imaging: US Guided Needle Placement  Result Date: 09/06/2020 Ultrasound-guided right AC joint injection: After sterile prep with Betadine, injected 3 cc 0.25% bupivacaine and 6 mg betamethasone without complication.   Assessment & Plan: 1.  Recurrent right shoulder AC joint arthropathy -AC joint injected under ultrasound  guidance as above.  Follow-up as needed.     Procedures: No procedures performed        PMFS History: Patient Active Problem List   Diagnosis Date Noted  . Elevated LFTs 03/24/2019  . Overweight (BMI 25.0-29.9) 03/23/2019  . Hyperlipidemia 03/23/2019  . Headache 09/22/2018  . Obstructive sleep apnea syndrome 09/16/2017  . Essential hypertension 05/04/2016  . Vitamin D deficiency 05/04/2016  . Medication management 05/04/2016  . Hypogonadism in male 08/14/2015  . Back pain 11/27/2014   Past Medical History:  Diagnosis Date  . Sleep apnea     Family History  Problem Relation Age of Onset  . Heart disease Father 26       CABG  . Hypertension Father   . Hyperlipidemia Father   . Diabetes Paternal Grandfather     Past Surgical History:  Procedure Laterality Date  . TRIGGER FINGER RELEASE Right 04/11/2020   Procedure: RELEASE TRIGGER FINGER/A-1 PULLEY RIGHT MIDDLE FINGER;  Surgeon: Cindee Salt, MD;  Location: [facility];  Service: Orthopedics;  Laterality: Right;  IV REGIONAL FOREARM BLOCK   Social History   Occupational History  . Not on file  Tobacco Use  . Smoking status: Never Smoker  . Smokeless tobacco: Never Used  Substance and Sexual Activity  . Alcohol use: Yes    Alcohol/week: 14.0 standard drinks  Types: 14 Standard drinks or equivalent per week    Comment: beer  . Drug use: No  . Sexual activity: Yes

## 2020-09-24 ENCOUNTER — Other Ambulatory Visit: Payer: Self-pay

## 2020-09-24 ENCOUNTER — Ambulatory Visit (INDEPENDENT_AMBULATORY_CARE_PROVIDER_SITE_OTHER): Payer: BC Managed Care – PPO | Admitting: Adult Health Nurse Practitioner

## 2020-09-24 ENCOUNTER — Encounter: Payer: Self-pay | Admitting: Adult Health Nurse Practitioner

## 2020-09-24 VITALS — BP 122/80 | HR 104 | Temp 97.3°F | Wt 204.0 lb

## 2020-09-24 DIAGNOSIS — Z131 Encounter for screening for diabetes mellitus: Secondary | ICD-10-CM

## 2020-09-24 DIAGNOSIS — Z13220 Encounter for screening for lipoid disorders: Secondary | ICD-10-CM

## 2020-09-24 DIAGNOSIS — Z1321 Encounter for screening for nutritional disorder: Secondary | ICD-10-CM

## 2020-09-24 DIAGNOSIS — I10 Essential (primary) hypertension: Secondary | ICD-10-CM

## 2020-09-24 DIAGNOSIS — Z79899 Other long term (current) drug therapy: Secondary | ICD-10-CM | POA: Diagnosis not present

## 2020-09-24 DIAGNOSIS — G4733 Obstructive sleep apnea (adult) (pediatric): Secondary | ICD-10-CM

## 2020-09-24 DIAGNOSIS — E663 Overweight: Secondary | ICD-10-CM

## 2020-09-24 DIAGNOSIS — Z0000 Encounter for general adult medical examination without abnormal findings: Secondary | ICD-10-CM | POA: Diagnosis not present

## 2020-09-24 DIAGNOSIS — Z1329 Encounter for screening for other suspected endocrine disorder: Secondary | ICD-10-CM

## 2020-09-24 DIAGNOSIS — Z23 Encounter for immunization: Secondary | ICD-10-CM | POA: Diagnosis not present

## 2020-09-24 DIAGNOSIS — Z130 Encounter for screening for diseases of the blood and blood-forming organs and certain disorders involving the immune mechanism: Secondary | ICD-10-CM | POA: Diagnosis not present

## 2020-09-24 DIAGNOSIS — E559 Vitamin D deficiency, unspecified: Secondary | ICD-10-CM | POA: Diagnosis not present

## 2020-09-24 DIAGNOSIS — Z1389 Encounter for screening for other disorder: Secondary | ICD-10-CM | POA: Diagnosis not present

## 2020-09-24 DIAGNOSIS — E782 Mixed hyperlipidemia: Secondary | ICD-10-CM

## 2020-09-24 DIAGNOSIS — Z136 Encounter for screening for cardiovascular disorders: Secondary | ICD-10-CM

## 2020-09-24 DIAGNOSIS — E291 Testicular hypofunction: Secondary | ICD-10-CM

## 2020-09-24 DIAGNOSIS — L509 Urticaria, unspecified: Secondary | ICD-10-CM

## 2020-09-24 MED ORDER — DEXAMETHASONE 4 MG PO TABS: ORAL_TABLET | ORAL | 1 refills | Status: DC

## 2020-09-24 NOTE — Progress Notes (Signed)
Assessment and Plan:  Dale Conley was seen today for acute visit, irritable bowel syndrome and diarrhea.  Discussed multiple etiologies.  Discussed returning samples prior to any treatments.  Discussed initiating treatment for presumed infectious etiology related to symptoms and duration.  Diagnoses and all orders for this visit: Dale Conley was seen today for annual exam.  Diagnoses and all orders for this visit:  Essential hypertension No medications Monitor blood pressure at home; call if consistently over 130/80 Continue DASH diet.   Reminder to go to the ER if any CP, SOB, nausea, dizziness, severe HA, changes vision/speech, left arm numbness and tingling and jaw pain.  Mixed hyperlipidemia No medications Discussed dietary and exercise modifications Low fat diet  Hypogonadism in male No supplementation at this time Lifting weight, when back has improved  Overweight (BMI 25.0-29.9) Discussed dietary and exercise modifications  Vitamin D deficiency Continue supplementation Will monitor next routine visit  Hypogonadism in male Doing well at this time Exercising daily  BMI 27.0-27.9 Discussed dietary and exercise modifications STOP metabolism supplements with caffeine Cause of diarrhea?  Medication management Continued  Screening for diabetes mellitus -A1c Screening, ischemic heart disease -EKG Encounter for vitamin deficiency screening - B12 Screening for blood or protein in urine -UA Screening for thyroid disorder -TSH Screening, iron deficiency anemia -Iron panel   Patient agrees with plan of care.  Discussed when to call or return and hospital precautions. Further disposition pending results of labs. Discussed med's effects and SE's.   Over 30 minutes of face to face interview, exam, counseling, chart review, and critical decision making was performed.   Future Appointments  Date Time Provider Department Center  09/24/2021  3:00 PM Elder Negus, NP  [facility] None    ------------------------------------------------------------------------------------------------------------------   HPI 44 y.o.male presents for complete physical.  Reports he had a back injury.  He has filed this through workers comp and he reports it has been difficult to get treatment for this.  He is managing at this time but has periods of more severe pain. Occ lower back pain, has seen chiropractor and has done at home stretches, will having back issues, will have occ bilateral tingling lateral legs, very rare, will only go down to mid hip. Ibuprofen, tylenol can help.  Takes tramadol. occ for back.  He had a sleep study and recommend CPAP from [facility]. He is on the nose piece, could not tolerate it. States last several nights not able to wear it due to nasal congestion. Could not tolerate mouth piece.   His blood pressure has been controlled at home, today their BP is BP: 122/80  He does workout. He denies chest pain, shortness of breath, dizziness.  He is not on cholesterol medication and denies myalgias. His cholesterol is not at goal. The cholesterol last visit was:   Lab Results  Component Value Date   CHOL 225 (H) 03/23/2019   HDL 81 03/23/2019   LDLCALC 121 (H) 03/23/2019   TRIG 121 03/23/2019   CHOLHDL 2.8 03/23/2019    He has been working on diet and exercise for prediabetes, and denies polydipsia, polyuria, visual disturbances, vomiting and weight loss. Last A1C in the office was:  Lab Results  Component Value Date   HGBA1C 5.5 05/01/2015   Patient is on Vitamin D supplement.   Lab Results  Component Value Date   VD25OH 19 (L) 09/22/2018       He does not take vayarin regularly, gets from pharmacy, takes as needed.  He has a history  of testosterone deficiency, it not on anything, states it makes him irritable.  Lab Results  Component Value Date   TESTOSTERONE 238 (L) 09/22/2018     Names of Other Physician/Practitioners you  currently use: 1. Dale Conley here for primary care 2.  Eye Exam: Declines 3. Dentist: Declines relate dot COVID19  Patient Care Team: Lucky Cowboy, MD as PCP - General (Internal [facility])    Screening Tests: Immunization History  Administered Date(s) Administered   Influenza Inj Mdck Quad With Preservative 07/19/2019   PFIZER SARS-COV-2 Vaccination 12/22/2019, 01/12/2020    Preventative care: Last colonoscopy: N/A   Vaccinations: TD or Tdap: 2015 Influenza: 08/2020 received today Pneumococcal: N/A Prevnar13: N/A  Shingles/Zostavax: N/A  [place]ARs-COV2[place] 07/2020    Past Medical History:  Diagnosis Date   Sleep apnea      Allergies  Allergen Reactions   Flexeril [Cyclobenzaprine] Other (See Comments)    Makes pt groggy    Current Outpatient Medications on File Prior to Visit  Medication Sig   meloxicam (MOBIC) 15 MG tablet Take 1/2 to 1 tablet Daily with Food for Pain & Inflammation   methocarbamol (ROBAXIN) 500 MG tablet TAKE 1 TABLET(500 MG) BY MOUTH TWICE DAILY AS NEEDED FOR MUSCLE SPASMS   Vitamin D, Ergocalciferol, (DRISDOL) 1.25 MG (50000 UNIT) CAPS capsule TAKE 1 CAPSULE BY MOUTH 2 DAYS PER WEEK   No current facility-administered medications on file prior to visit.    ROS: Review of Systems  Constitutional: Negative for chills, diaphoresis, fever, malaise/fatigue and weight loss.  HENT: Negative for congestion, ear discharge, ear pain, hearing loss, nosebleeds, sinus pain, sore throat and tinnitus.   Eyes: Negative for blurred vision, double vision, photophobia, pain, discharge and redness.  Respiratory: Negative for cough, hemoptysis, sputum production, shortness of breath, wheezing and stridor.   Cardiovascular: Negative for chest pain, palpitations, orthopnea, claudication, leg swelling and PND.  Gastrointestinal: Negative for abdominal pain, blood in stool, constipation, diarrhea, heartburn,  melena, nausea and vomiting.  Genitourinary: Negative for dysuria, flank pain, frequency, hematuria and urgency.  Musculoskeletal: Positive for back pain. Negative for falls, joint pain, myalgias and neck pain.       W/ sciatic pain, following with workers comp for this.  Skin: Negative for itching and rash.  Neurological: Negative for dizziness, tingling, tremors, sensory change, speech change, focal weakness, seizures, loss of consciousness, weakness and headaches.  Endo/Heme/Allergies: Negative for environmental allergies and polydipsia. Does not bruise/bleed easily.  Psychiatric/Behavioral: Negative for depression, hallucinations, memory loss, substance abuse and suicidal ideas. The patient is not nervous/anxious and does not have insomnia.     Physical Exam:  BP 122/80    Pulse (!) 104    Temp (!) 97.3 F (36.3 C)    Wt 204 lb (92.5 kg)    SpO2 96%    BMI 29.27 kg/m   General Appearance: Well nourished, in no apparent distress. Eyes: PERRLA, EOMs, conjunctiva no swelling or erythema Sinuses: No Frontal/maxillary tenderness ENT/Mouth: Ext aud canals clear, TMs without erythema, bulging. No erythema, swelling, or exudate on post pharynx.  Tonsils not swollen or erythematous. Hearing normal.  Neck: Supple, thyroid normal.  Respiratory: Respiratory effort normal, BS equal bilaterally without rales, rhonchi, wheezing or stridor.  Cardio: RRR with no MRGs. Brisk peripheral pulses without edema.  Abdomen: Soft, + BS.  Non tender, no guarding, rebound, hernias, masses. Lymphatics: Non tender without lymphadenopathy.  Musculoskeletal: Full ROM, 5/5 strength, normal gait. Pain with bending at waist.  Skin: Warm, dry without rashes, lesions, ecchymosis.  Neuro: Cranial nerves intact. Normal muscle tone, no cerebellar symptoms. Sensation intact.  Psych: Awake and oriented X 3, normal affect, Insight and Judgment appropriate.    EKG: NSR  Loni Dolly, DNP [facility] 09/24/2020  3:40 PM

## 2020-09-25 LAB — CBC WITH DIFFERENTIAL/PLATELET
Absolute Monocytes: 639 cells/uL (ref 200–950)
Basophils Absolute: 48 cells/uL (ref 0–200)
Basophils Relative: 0.7 %
Eosinophils Absolute: 313 cells/uL (ref 15–500)
Eosinophils Relative: 4.6 %
HCT: 44.8 % (ref 38.5–50.0)
Hemoglobin: 15.5 g/dL (ref 13.2–17.1)
Lymphs Abs: 2761 cells/uL (ref 850–3900)
MCH: 32.4 pg (ref 27.0–33.0)
MCHC: 34.6 g/dL (ref 32.0–36.0)
MCV: 93.7 fL (ref 80.0–100.0)
MPV: 9.6 fL (ref 7.5–12.5)
Monocytes Relative: 9.4 %
Neutro Abs: 3040 cells/uL (ref 1500–7800)
Neutrophils Relative %: 44.7 %
Platelets: 261 10*3/uL (ref 140–400)
RBC: 4.78 10*6/uL (ref 4.20–5.80)
RDW: 11.9 % (ref 11.0–15.0)
Total Lymphocyte: 40.6 %
WBC: 6.8 10*3/uL (ref 3.8–10.8)

## 2020-09-25 LAB — COMPLETE METABOLIC PANEL WITH GFR
AG Ratio: 1.7 (calc) (ref 1.0–2.5)
ALT: 25 U/L (ref 9–46)
AST: 24 U/L (ref 10–40)
Albumin: 4.4 g/dL (ref 3.6–5.1)
Alkaline phosphatase (APISO): 66 U/L (ref 36–130)
BUN: 22 mg/dL (ref 7–25)
CO2: 27 mmol/L (ref 20–32)
Calcium: 10 mg/dL (ref 8.6–10.3)
Chloride: 102 mmol/L (ref 98–110)
Creat: 0.98 mg/dL (ref 0.60–1.35)
GFR, Est African American: 108 mL/min/{1.73_m2} (ref >=60)
GFR, Est Non African American: 93 mL/min/{1.73_m2} (ref >=60)
Globulin: 2.6 g/dL (calc) (ref 1.9–3.7)
Glucose, Bld: 84 mg/dL (ref 65–99)
Potassium: 4.1 mmol/L (ref 3.5–5.3)
Sodium: 137 mmol/L (ref 135–146)
Total Bilirubin: 0.4 mg/dL (ref 0.2–1.2)
Total Protein: 7 g/dL (ref 6.1–8.1)

## 2020-09-25 LAB — URINALYSIS W MICROSCOPIC + REFLEX CULTURE
Bacteria, UA: NONE SEEN /HPF
Bilirubin Urine: NEGATIVE
Glucose, UA: NEGATIVE
Hgb urine dipstick: NEGATIVE
Hyaline Cast: NONE SEEN /LPF
Ketones, ur: NEGATIVE
Leukocyte Esterase: NEGATIVE
Nitrites, Initial: NEGATIVE
Protein, ur: NEGATIVE
RBC / HPF: NONE SEEN /HPF (ref 0–2)
Specific Gravity, Urine: 1.028 (ref 1.001–1.03)
Squamous Epithelial / HPF: NONE SEEN /HPF (ref <=5)
WBC, UA: NONE SEEN /HPF (ref 0–5)
pH: 5.5 (ref 5.0–8.0)

## 2020-09-25 LAB — IRON, TOTAL/TOTAL IRON BINDING CAP
%SAT: 29 % (calc) (ref 20–48)
Iron: 119 ug/dL (ref 50–180)
TIBC: 406 mcg/dL (calc) (ref 250–425)

## 2020-09-25 LAB — VITAMIN B12: Vitamin B-12: 620 pg/mL (ref 200–1100)

## 2020-09-25 LAB — HEMOGLOBIN A1C
Hgb A1c MFr Bld: 5.2 % of total Hgb (ref <5.7)
Mean Plasma Glucose: 103 mg/dL
eAG (mmol/L): 5.7 mmol/L

## 2020-09-25 LAB — TSH: TSH: 1.07 mIU/L (ref 0.40–4.50)

## 2020-09-25 LAB — LIPID PANEL
Cholesterol: 235 mg/dL — ABNORMAL HIGH (ref <200)
HDL: 70 mg/dL (ref >=40)
LDL Cholesterol (Calc): 131 mg/dL (calc) — ABNORMAL HIGH
Non-HDL Cholesterol (Calc): 165 mg/dL (calc) — ABNORMAL HIGH (ref <130)
Total CHOL/HDL Ratio: 3.4 (calc) (ref <5.0)
Triglycerides: 202 mg/dL — ABNORMAL HIGH (ref <150)

## 2020-09-25 LAB — MAGNESIUM: Magnesium: 2 mg/dL (ref 1.5–2.5)

## 2020-09-25 LAB — VITAMIN D 25 HYDROXY (VIT D DEFICIENCY, FRACTURES): Vit D, 25-Hydroxy: 68 ng/mL (ref 30–100)

## 2020-09-25 LAB — NO CULTURE INDICATED

## 2020-10-22 DIAGNOSIS — M5416 Radiculopathy, lumbar region: Secondary | ICD-10-CM | POA: Diagnosis not present

## 2020-11-05 DIAGNOSIS — M5416 Radiculopathy, lumbar region: Secondary | ICD-10-CM | POA: Diagnosis not present

## 2020-11-06 ENCOUNTER — Other Ambulatory Visit: Payer: Self-pay | Admitting: Family Medicine

## 2020-11-22 DIAGNOSIS — M5416 Radiculopathy, lumbar region: Secondary | ICD-10-CM | POA: Diagnosis not present

## 2020-11-28 ENCOUNTER — Other Ambulatory Visit: Payer: Self-pay | Admitting: Orthopedic Surgery

## 2020-11-29 DIAGNOSIS — M545 Low back pain, unspecified: Secondary | ICD-10-CM | POA: Diagnosis not present

## 2020-12-02 ENCOUNTER — Other Ambulatory Visit: Payer: Self-pay

## 2020-12-02 MED ORDER — METHOCARBAMOL 500 MG PO TABS: ORAL_TABLET | ORAL | 2 refills | Status: DC

## 2020-12-02 NOTE — Telephone Encounter (Signed)
Refill on METHOCARBAMOL 500 mg

## 2020-12-13 ENCOUNTER — Other Ambulatory Visit: Payer: Self-pay | Admitting: Adult Health

## 2020-12-13 DIAGNOSIS — M545 Low back pain, unspecified: Secondary | ICD-10-CM

## 2020-12-13 MED ORDER — TRAMADOL HCL 50 MG PO TABS: 50.0000 mg | ORAL_TABLET | Freq: Four times a day (QID) | ORAL | 0 refills | Status: AC | PRN

## 2020-12-13 NOTE — Progress Notes (Signed)
Future Appointments  Date Time Provider Department Center  09/24/2021  3:00 PM Elder Negus, NP [facility] None   Patient called to report acute flare of back pain; having upcoming surgery on 12/25/2020. Requesting tramadol which he last had 03/2020. Has been trying NSAIDs, muscle relaxer. PDMP reviewed, no recent scripts. Will send in limited script for severe pain only, will provide no further refills and defer to neuro/surgery.

## 2020-12-20 NOTE — Pre-Procedure Instructions (Signed)
Surgical Instructions:    Your procedure is scheduled on Wednesday, 12/25/20 (11:30 AM- 2:01 PM).  Report to [facility] Main Entrance "A" at 08:30 A.M., then check in with the Admitting office.  Call this number if you have problems the morning of surgery:  [phone]   If you have any questions prior to your surgery date call [phone]: Open Monday-Friday 8am-4pm.    Remember:  Do not eat after midnight the night before your surgery.  You may drink clear liquids until 08:30 AM the morning of your surgery.   Clear liquids allowed are: Water, Non-Citrus Juices (without pulp), Carbonated Beverages, Clear Tea, Black Coffee Only, and Gatorade.  . The day of surgery (if you do NOT have diabetes):  o Drink ONE (1) Pre-Surgery Clear Ensure by 08:30 AM the morning of surgery   o This drink was given to you during your hospital pre-op appointment visit. o Nothing else to drink after completing the Pre-Surgery Clear Ensure.         If you have questions, please contact your surgeon's office.     Take these medicines the morning of surgery with A SIP OF WATER: methocarbamol (ROBAXIN)- if needed   As of today, STOP taking any Aspirin (unless otherwise instructed by your surgeon), NSAIDs such as meloxicam (MOBIC), nabumetone (RELAFEN), Aleve, Naproxen, Ibuprofen, Motrin, Advil, Goody's, BC's, all herbal medications, fish oil, and all vitamins.           The Morning of Surgery:            Do not wear jewelry,            Do not wear lotions, powders, colognes, or deodorant.            Men may shave face and neck.            Do not bring valuables to the hospital.            [facility] is not responsible for any belongings or valuables.  Do NOT Smoke (Tobacco/Vaping) or drink Alcohol 24 hours prior to your procedure.  If you use a CPAP at night, you may bring all equipment for your overnight stay.   Contacts, glasses, dentures or bridgework may not be worn into surgery, please bring  cases for these belongings   For patients admitted to the hospital, discharge time will be determined by your treatment team.   Patients discharged the day of surgery will not be allowed to drive home, and someone needs to stay with them for 24 hours.    Special instructions:   [facility]- Preparing For Surgery  Before surgery, you can play an important role. Because skin is not sterile, your skin needs to be as free of germs as possible. You can reduce the number of germs on your skin by washing with CHG (chlorahexidine gluconate) Soap before surgery.  CHG is an antiseptic cleaner which kills germs and bonds with the skin to continue killing germs even after washing.    Oral Hygiene is also important to reduce your risk of infection.  Remember - BRUSH YOUR TEETH THE MORNING OF SURGERY WITH YOUR REGULAR TOOTHPASTE  Please do not use if you have an allergy to CHG or antibacterial soaps. If your skin becomes reddened/irritated stop using the CHG.  Do not shave (including legs and underarms) for at least 48 hours prior to first CHG shower. It is OK to shave your face.  Please follow these instructions carefully.  1. Shower the NIGHT BEFORE SURGERY and the MORNING OF SURGERY  2. If you chose to wash your hair, wash your hair first as usual with your normal shampoo.  3. After you shampoo, rinse your hair and body thoroughly to remove the shampoo.  4. Wash Face and genitals (private parts) with your normal soap.   5.  Shower the NIGHT BEFORE SURGERY and the MORNING OF SURGERY with CHG Soap.   6. Use CHG Soap as you would any other liquid soap. You can apply CHG directly to the skin and wash gently with a scrungie or a clean washcloth.   7. Apply the CHG Soap to your body ONLY FROM THE NECK DOWN.  Do not use on open wounds or open sores. Avoid contact with your eyes, ears, mouth and genitals (private parts). Wash Face and genitals (private parts)  with your normal soap.   8. Wash  thoroughly, paying special attention to the area where your surgery will be performed.  9. Thoroughly rinse your body with warm water from the neck down.  10. DO NOT shower/wash with your normal soap after using and rinsing off the CHG Soap.  11. Pat yourself dry with a CLEAN TOWEL.  12. Wear CLEAN PAJAMAS to bed the night before surgery  13. Place CLEAN SHEETS on your bed the night before your surgery  14. DO NOT SLEEP WITH PETS.   Day of Surgery: SHOWER WITH CHG SOAP. Wear Clean/Comfortable clothing the morning of surgery Do not apply any deodorants/lotions.   Remember to brush your teeth WITH YOUR REGULAR TOOTHPASTE.   Please read over the following fact sheets that you were given.

## 2020-12-23 ENCOUNTER — Other Ambulatory Visit: Payer: Self-pay

## 2020-12-23 ENCOUNTER — Encounter (HOSPITAL_COMMUNITY): Payer: Self-pay

## 2020-12-23 ENCOUNTER — Encounter (HOSPITAL_COMMUNITY)
Admission: RE | Admit: 2020-12-23 | Discharge: 2020-12-23 | Disposition: A | Discharge status: Home / Self Care | Payer: BC Managed Care – PPO | Source: Ambulatory Visit | Attending: Orthopedic Surgery | Admitting: Orthopedic Surgery

## 2020-12-23 DIAGNOSIS — Z01812 Encounter for preprocedural laboratory examination: Secondary | ICD-10-CM | POA: Insufficient documentation

## 2020-12-23 DIAGNOSIS — Z20822 Contact with and (suspected) exposure to covid-19: Secondary | ICD-10-CM | POA: Insufficient documentation

## 2020-12-23 DIAGNOSIS — Z791 Long term (current) use of non-steroidal anti-inflammatories (NSAID): Secondary | ICD-10-CM | POA: Diagnosis not present

## 2020-12-23 DIAGNOSIS — M5117 Intervertebral disc disorders with radiculopathy, lumbosacral region: Secondary | ICD-10-CM | POA: Diagnosis not present

## 2020-12-23 LAB — COMPREHENSIVE METABOLIC PANEL
ALT: 28 U/L (ref 0–44)
AST: 24 U/L (ref 15–41)
Albumin: 4.1 g/dL (ref 3.5–5.0)
Alkaline Phosphatase: 57 U/L (ref 38–126)
Anion gap: 5 (ref 5–15)
BUN: 13 mg/dL (ref 6–20)
CO2: 27 mmol/L (ref 22–32)
Calcium: 9.6 mg/dL (ref 8.9–10.3)
Chloride: 106 mmol/L (ref 98–111)
Creatinine, Ser: 1.02 mg/dL (ref 0.61–1.24)
GFR, Estimated: >60 mL/min (ref >60)
Glucose, Bld: 97 mg/dL (ref 70–99)
Potassium: 3.8 mmol/L (ref 3.5–5.1)
Sodium: 138 mmol/L (ref 135–145)
Total Bilirubin: 0.5 mg/dL (ref 0.3–1.2)
Total Protein: 7.3 g/dL (ref 6.5–8.1)

## 2020-12-23 LAB — APTT: aPTT: 25 seconds (ref 24–36)

## 2020-12-23 LAB — CBC WITH DIFFERENTIAL/PLATELET
Abs Immature Granulocytes: 0.01 10*3/uL (ref 0.00–0.07)
Basophils Absolute: 0 10*3/uL (ref 0.0–0.1)
Basophils Relative: 1 %
Eosinophils Absolute: 0.2 10*3/uL (ref 0.0–0.5)
Eosinophils Relative: 5 %
HCT: 47.4 % (ref 39.0–52.0)
Hemoglobin: 16 g/dL (ref 13.0–17.0)
Immature Granulocytes: 0 %
Lymphocytes Relative: 46 %
Lymphs Abs: 2.2 10*3/uL (ref 0.7–4.0)
MCH: 32 pg (ref 26.0–34.0)
MCHC: 33.8 g/dL (ref 30.0–36.0)
MCV: 94.8 fL (ref 80.0–100.0)
Monocytes Absolute: 0.5 10*3/uL (ref 0.1–1.0)
Monocytes Relative: 11 %
Neutro Abs: 1.8 10*3/uL (ref 1.7–7.7)
Neutrophils Relative %: 37 %
Platelets: 236 10*3/uL (ref 150–400)
RBC: 5 MIL/uL (ref 4.22–5.81)
RDW: 11.6 % (ref 11.5–15.5)
WBC: 4.8 10*3/uL (ref 4.0–10.5)
nRBC: 0 % (ref 0.0–0.2)

## 2020-12-23 LAB — URINALYSIS, ROUTINE W REFLEX MICROSCOPIC
Bilirubin Urine: NEGATIVE
Glucose, UA: NEGATIVE mg/dL
Hgb urine dipstick: NEGATIVE
Ketones, ur: NEGATIVE mg/dL
Leukocytes,Ua: NEGATIVE
Nitrite: NEGATIVE
Protein, ur: NEGATIVE mg/dL
Specific Gravity, Urine: 1.019 (ref 1.005–1.030)
pH: 5 (ref 5.0–8.0)

## 2020-12-23 LAB — TYPE AND SCREEN
ABO/RH(D): A POS
Antibody Screen: NEGATIVE

## 2020-12-23 LAB — PROTIME-INR
INR: 0.9 (ref 0.8–1.2)
Prothrombin Time: 12.2 seconds (ref 11.4–15.2)

## 2020-12-23 LAB — SURGICAL PCR SCREEN
MRSA, PCR: NEGATIVE
Staphylococcus aureus: POSITIVE — AB

## 2020-12-23 LAB — SARS CORONAVIRUS 2 (TAT 6-24 HRS): SARS Coronavirus 2: NEGATIVE

## 2020-12-23 NOTE — Progress Notes (Signed)
PCP - Dr. Lucky Cowboy Cardiologist - denies  Chest x-ray - n/a EKG - 09/24/20 Stress Test - denies ECHO - denies Cardiac Cath - denies  Sleep Study - OSA+ CPAP - pt states he uses CPAP when he remembers to use it. Does not know settings. Pt states that his bed has an incline. Pt asked to bring equipment in the event he has an overnight stay.   Blood Thinner Instructions: n/a Aspirin Instructions: n/a  ERAS Protcol -Yes, pt to stop clear liquids by 0830 DOS. PRE-SURGERY Ensure or G2- Pt provided with (1) pre-surgical ensure.  COVID TEST- 12/23/20  Anesthesia review: BP 148/101 (rt arm), 144/102 (left arm). Pt does not claim to have BP issues. Does not take any meds for BP. Pt states that this is not his normal. Advised pt to watch sodium intake and increase water up until surgery date. Also advised him to monitor BP at home for the next few days. If BP continues to stay elevated he may need to consult PCP for further evaluation. BP at end of appt: 139/104 (rt arm).   Patient denies shortness of breath, fever, cough and chest pain at PAT appointment   All instructions explained to the patient, with a verbal understanding of the material. Patient agrees to go over the instructions while at home for a better understanding. Patient also instructed to self quarantine after being tested for COVID-19. The opportunity to ask questions was provided.

## 2020-12-24 DIAGNOSIS — M5416 Radiculopathy, lumbar region: Secondary | ICD-10-CM | POA: Diagnosis not present

## 2020-12-25 ENCOUNTER — Ambulatory Visit (HOSPITAL_COMMUNITY): Payer: BC Managed Care – PPO

## 2020-12-25 ENCOUNTER — Ambulatory Visit (HOSPITAL_COMMUNITY): Payer: BC Managed Care – PPO | Admitting: Physician Assistant

## 2020-12-25 ENCOUNTER — Other Ambulatory Visit: Payer: Self-pay

## 2020-12-25 ENCOUNTER — Ambulatory Visit (HOSPITAL_COMMUNITY)
Admission: RE | Admit: 2020-12-25 | Discharge: 2020-12-25 | Disposition: A | Discharge status: Home / Self Care | Payer: BC Managed Care – PPO | Attending: Orthopedic Surgery | Admitting: Orthopedic Surgery

## 2020-12-25 ENCOUNTER — Ambulatory Visit (HOSPITAL_COMMUNITY)
Admission: RE | Disposition: A | Discharge status: Home / Self Care | Payer: Self-pay | Source: Home / Self Care | Attending: Orthopedic Surgery

## 2020-12-25 ENCOUNTER — Ambulatory Visit (HOSPITAL_COMMUNITY): Payer: BC Managed Care – PPO | Admitting: Anesthesiology

## 2020-12-25 ENCOUNTER — Encounter (HOSPITAL_COMMUNITY): Payer: Self-pay | Admitting: Orthopedic Surgery

## 2020-12-25 DIAGNOSIS — Z20822 Contact with and (suspected) exposure to covid-19: Secondary | ICD-10-CM | POA: Diagnosis not present

## 2020-12-25 DIAGNOSIS — M5117 Intervertebral disc disorders with radiculopathy, lumbosacral region: Secondary | ICD-10-CM | POA: Insufficient documentation

## 2020-12-25 DIAGNOSIS — Z791 Long term (current) use of non-steroidal anti-inflammatories (NSAID): Secondary | ICD-10-CM | POA: Diagnosis not present

## 2020-12-25 DIAGNOSIS — M5127 Other intervertebral disc displacement, lumbosacral region: Secondary | ICD-10-CM | POA: Diagnosis not present

## 2020-12-25 DIAGNOSIS — M5418 Radiculopathy, sacral and sacrococcygeal region: Secondary | ICD-10-CM | POA: Diagnosis not present

## 2020-12-25 DIAGNOSIS — M47816 Spondylosis without myelopathy or radiculopathy, lumbar region: Secondary | ICD-10-CM | POA: Diagnosis not present

## 2020-12-25 DIAGNOSIS — G4733 Obstructive sleep apnea (adult) (pediatric): Secondary | ICD-10-CM | POA: Diagnosis not present

## 2020-12-25 DIAGNOSIS — I10 Essential (primary) hypertension: Secondary | ICD-10-CM | POA: Diagnosis not present

## 2020-12-25 DIAGNOSIS — Z419 Encounter for procedure for purposes other than remedying health state, unspecified: Secondary | ICD-10-CM

## 2020-12-25 DIAGNOSIS — I700 Atherosclerosis of aorta: Secondary | ICD-10-CM | POA: Diagnosis not present

## 2020-12-25 DIAGNOSIS — E785 Hyperlipidemia, unspecified: Secondary | ICD-10-CM | POA: Diagnosis not present

## 2020-12-25 HISTORY — PX: LUMBAR LAMINECTOMY/DECOMPRESSION MICRODISCECTOMY: SHX5026

## 2020-12-25 LAB — ABO/RH: ABO/RH(D): A POS

## 2020-12-25 SURGERY — LUMBAR LAMINECTOMY/DECOMPRESSION MICRODISCECTOMY
Anesthesia: General | Laterality: Left

## 2020-12-25 MED ORDER — PROMETHAZINE HCL 25 MG/ML IJ SOLN: 6.2500 mg | INTRAMUSCULAR | Status: DC | PRN

## 2020-12-25 MED ORDER — CHLORHEXIDINE GLUCONATE 0.12 % MT SOLN
OROMUCOSAL | Status: AC
  Administered 2020-12-25: 15 mL via OROMUCOSAL
  Filled 2020-12-25: qty 15

## 2020-12-25 MED ORDER — PROPOFOL 10 MG/ML IV BOLUS
INTRAVENOUS | Status: AC
  Filled 2020-12-25: qty 20

## 2020-12-25 MED ORDER — POVIDONE-IODINE 7.5 % EX SOLN
Freq: Once | CUTANEOUS | Status: DC
  Filled 2020-12-25: qty 118

## 2020-12-25 MED ORDER — BUPIVACAINE-EPINEPHRINE 0.25% -1:200000 IJ SOLN
INTRAMUSCULAR | Status: DC | PRN
  Administered 2020-12-25: 20 mL
  Administered 2020-12-25: 5 mL

## 2020-12-25 MED ORDER — THROMBIN (RECOMBINANT) 20000 UNITS EX SOLR
CUTANEOUS | Status: AC
  Filled 2020-12-25: qty 20000

## 2020-12-25 MED ORDER — FENTANYL CITRATE (PF) 100 MCG/2ML IJ SOLN
25.0000 ug | INTRAMUSCULAR | Status: DC | PRN
  Administered 2020-12-25: 50 ug via INTRAVENOUS

## 2020-12-25 MED ORDER — LACTATED RINGERS IV SOLN: INTRAVENOUS | Status: DC

## 2020-12-25 MED ORDER — BUPIVACAINE LIPOSOME 1.3 % IJ SUSP
INTRAMUSCULAR | Status: DC | PRN
  Administered 2020-12-25: 20 mL

## 2020-12-25 MED ORDER — ONDANSETRON HCL 4 MG/2ML IJ SOLN
INTRAMUSCULAR | Status: DC | PRN
  Administered 2020-12-25: 4 mg via INTRAVENOUS

## 2020-12-25 MED ORDER — CEFAZOLIN SODIUM-DEXTROSE 2-4 GM/100ML-% IV SOLN
INTRAVENOUS | Status: AC
  Filled 2020-12-25: qty 100

## 2020-12-25 MED ORDER — FENTANYL CITRATE (PF) 250 MCG/5ML IJ SOLN
INTRAMUSCULAR | Status: DC | PRN
  Administered 2020-12-25 (×3): 50 ug via INTRAVENOUS
  Administered 2020-12-25: 100 ug via INTRAVENOUS

## 2020-12-25 MED ORDER — ACETAMINOPHEN 500 MG PO TABS
ORAL_TABLET | ORAL | Status: AC
  Administered 2020-12-25: 1000 mg via ORAL
  Filled 2020-12-25: qty 2

## 2020-12-25 MED ORDER — LIDOCAINE HCL (CARDIAC) PF 100 MG/5ML IV SOSY
PREFILLED_SYRINGE | INTRAVENOUS | Status: DC | PRN
  Administered 2020-12-25: 100 mg via INTRAVENOUS

## 2020-12-25 MED ORDER — METHOCARBAMOL 500 MG PO TABS: ORAL_TABLET | ORAL | 2 refills | Status: DC

## 2020-12-25 MED ORDER — HEMOSTATIC AGENTS (NO CHARGE) OPTIME
TOPICAL | Status: DC | PRN
  Administered 2020-12-25: 1 via TOPICAL

## 2020-12-25 MED ORDER — SUGAMMADEX SODIUM 200 MG/2ML IV SOLN
INTRAVENOUS | Status: DC | PRN
  Administered 2020-12-25: 200 mg via INTRAVENOUS

## 2020-12-25 MED ORDER — CEFAZOLIN SODIUM-DEXTROSE 2-4 GM/100ML-% IV SOLN
2.0000 g | INTRAVENOUS | Status: AC
  Administered 2020-12-25: 2 g via INTRAVENOUS

## 2020-12-25 MED ORDER — CHLORHEXIDINE GLUCONATE 0.12 % MT SOLN: 15.0000 mL | Freq: Once | OROMUCOSAL | Status: AC

## 2020-12-25 MED ORDER — PROPOFOL 10 MG/ML IV BOLUS
INTRAVENOUS | Status: DC | PRN
  Administered 2020-12-25: 200 mg via INTRAVENOUS

## 2020-12-25 MED ORDER — FENTANYL CITRATE (PF) 100 MCG/2ML IJ SOLN
INTRAMUSCULAR | Status: AC
  Filled 2020-12-25: qty 2

## 2020-12-25 MED ORDER — ROCURONIUM BROMIDE 10 MG/ML (PF) SYRINGE
PREFILLED_SYRINGE | INTRAVENOUS | Status: AC
  Filled 2020-12-25: qty 10

## 2020-12-25 MED ORDER — ACETAMINOPHEN 500 MG PO TABS: 1000.0000 mg | ORAL_TABLET | Freq: Once | ORAL | Status: AC

## 2020-12-25 MED ORDER — BUPIVACAINE LIPOSOME 1.3 % IJ SUSP
INTRAMUSCULAR | Status: AC
  Filled 2020-12-25: qty 20

## 2020-12-25 MED ORDER — MIDAZOLAM HCL 5 MG/5ML IJ SOLN
INTRAMUSCULAR | Status: DC | PRN
  Administered 2020-12-25: 2 mg via INTRAVENOUS

## 2020-12-25 MED ORDER — METHYLENE BLUE 0.5 % INJ SOLN
INTRAVENOUS | Status: DC | PRN
  Administered 2020-12-25: 1 mL via SUBMUCOSAL

## 2020-12-25 MED ORDER — HYDROCODONE-ACETAMINOPHEN 7.5-325 MG PO TABS: 1.0000 | ORAL_TABLET | Freq: Four times a day (QID) | ORAL | 0 refills | Status: DC | PRN

## 2020-12-25 MED ORDER — METHYLPREDNISOLONE ACETATE 40 MG/ML IJ SUSP
INTRAMUSCULAR | Status: DC | PRN
  Administered 2020-12-25: 40 mg

## 2020-12-25 MED ORDER — FENTANYL CITRATE (PF) 250 MCG/5ML IJ SOLN
INTRAMUSCULAR | Status: AC
  Filled 2020-12-25: qty 5

## 2020-12-25 MED ORDER — MIDAZOLAM HCL 2 MG/2ML IJ SOLN
INTRAMUSCULAR | Status: AC
  Filled 2020-12-25: qty 2

## 2020-12-25 MED ORDER — 0.9 % SODIUM CHLORIDE (POUR BTL) OPTIME
TOPICAL | Status: DC | PRN
  Administered 2020-12-25: 1000 mL

## 2020-12-25 MED ORDER — THROMBIN 20000 UNITS EX SOLR
CUTANEOUS | Status: DC | PRN
  Administered 2020-12-25: 20 mL via TOPICAL

## 2020-12-25 MED ORDER — ONDANSETRON HCL 4 MG/2ML IJ SOLN
INTRAMUSCULAR | Status: AC
  Filled 2020-12-25: qty 2

## 2020-12-25 MED ORDER — BUPIVACAINE-EPINEPHRINE (PF) 0.25% -1:200000 IJ SOLN
INTRAMUSCULAR | Status: AC
  Filled 2020-12-25: qty 30

## 2020-12-25 MED ORDER — METHYLENE BLUE 0.5 % INJ SOLN
INTRAVENOUS | Status: AC
  Filled 2020-12-25: qty 10

## 2020-12-25 MED ORDER — ORAL CARE MOUTH RINSE: 15.0000 mL | Freq: Once | OROMUCOSAL | Status: AC

## 2020-12-25 MED ORDER — METHYLPREDNISOLONE ACETATE 40 MG/ML IJ SUSP
INTRAMUSCULAR | Status: AC
  Filled 2020-12-25: qty 1

## 2020-12-25 MED ORDER — LIDOCAINE 2% (20 MG/ML) 5 ML SYRINGE
INTRAMUSCULAR | Status: AC
  Filled 2020-12-25: qty 5

## 2020-12-25 MED ORDER — ROCURONIUM BROMIDE 10 MG/ML (PF) SYRINGE
PREFILLED_SYRINGE | INTRAVENOUS | Status: DC | PRN
  Administered 2020-12-25: 10 mg via INTRAVENOUS
  Administered 2020-12-25: 60 mg via INTRAVENOUS

## 2020-12-25 SURGICAL SUPPLY — 71 items
BENZOIN TINCTURE PRP APPL 2/3 (GAUZE/BANDAGES/DRESSINGS) ×3 IMPLANT
BNDG GAUZE ELAST 4 BULKY (GAUZE/BANDAGES/DRESSINGS) ×3 IMPLANT
BUR ROUND PRECISION 4.0 (BURR) ×2 IMPLANT
BUR ROUND PRECISION 4.0MM (BURR) ×1
CABLE BIPOLOR RESECTION CORD (MISCELLANEOUS) ×3 IMPLANT
CANISTER SUCT 3000ML PPV (MISCELLANEOUS) ×3 IMPLANT
CARTRIDGE OIL MAESTRO DRILL (MISCELLANEOUS) ×1 IMPLANT
CLOSURE STERI-STRIP 1/2X4 (GAUZE/BANDAGES/DRESSINGS) ×1
CLOSURE WOUND 1/2 X4 (GAUZE/BANDAGES/DRESSINGS)
CLSR STERI-STRIP ANTIMIC 1/2X4 (GAUZE/BANDAGES/DRESSINGS) ×2 IMPLANT
COVER SURGICAL LIGHT HANDLE (MISCELLANEOUS) ×3 IMPLANT
COVER WAND RF STERILE (DRAPES) ×3 IMPLANT
DIFFUSER DRILL AIR PNEUMATIC (MISCELLANEOUS) ×3 IMPLANT
DRAIN CHANNEL 15F RND FF W/TCR (WOUND CARE) IMPLANT
DRAPE POUCH INSTRU U-SHP 10X18 (DRAPES) ×6 IMPLANT
DRAPE SURG 17X23 STRL (DRAPES) ×12 IMPLANT
DURAPREP 26ML APPLICATOR (WOUND CARE) ×3 IMPLANT
ELECT BLADE 4.0 EZ CLEAN MEGAD (MISCELLANEOUS) ×3
ELECT CAUTERY BLADE 6.4 (BLADE) ×3 IMPLANT
ELECT REM PT RETURN 9FT ADLT (ELECTROSURGICAL) ×3
ELECTRODE BLDE 4.0 EZ CLN MEGD (MISCELLANEOUS) ×1 IMPLANT
ELECTRODE REM PT RTRN 9FT ADLT (ELECTROSURGICAL) ×1 IMPLANT
EVACUATOR SILICONE 100CC (DRAIN) IMPLANT
FILTER STRAW FLUID ASPIR (MISCELLANEOUS) ×3 IMPLANT
GAUZE 4X4 16PLY RFD (DISPOSABLE) ×6 IMPLANT
GAUZE SPONGE 4X4 12PLY STRL (GAUZE/BANDAGES/DRESSINGS) ×3 IMPLANT
GLOVE BIO SURGEON STRL SZ7 (GLOVE) ×3 IMPLANT
GLOVE BIO SURGEON STRL SZ8 (GLOVE) ×3 IMPLANT
GLOVE SRG 8 PF TXTR STRL LF DI (GLOVE) ×1 IMPLANT
GLOVE SURG UNDER POLY LF SZ7 (GLOVE) ×3 IMPLANT
GLOVE SURG UNDER POLY LF SZ8 (GLOVE) ×2
GOWN STRL REUS W/ TWL LRG LVL3 (GOWN DISPOSABLE) ×1 IMPLANT
GOWN STRL REUS W/ TWL XL LVL3 (GOWN DISPOSABLE) ×2 IMPLANT
GOWN STRL REUS W/TWL LRG LVL3 (GOWN DISPOSABLE) ×2
GOWN STRL REUS W/TWL XL LVL3 (GOWN DISPOSABLE) ×4
IV CATH 14GX2 1/4 (CATHETERS) ×3 IMPLANT
KIT BASIN OR (CUSTOM PROCEDURE TRAY) ×3 IMPLANT
KIT POSITION SURG JACKSON T1 (MISCELLANEOUS) ×3 IMPLANT
KIT TURNOVER KIT B (KITS) ×3 IMPLANT
MARKER SKIN DUAL TIP RULER LAB (MISCELLANEOUS) ×3 IMPLANT
NEEDLE 18GX1X1/2 (RX/OR ONLY) (NEEDLE) ×3 IMPLANT
NEEDLE 22X1 1/2 (OR ONLY) (NEEDLE) ×3 IMPLANT
NEEDLE HYPO 25GX1X1/2 BEV (NEEDLE) ×3 IMPLANT
NEEDLE SPNL 18GX3.5 QUINCKE PK (NEEDLE) ×6 IMPLANT
NS IRRIG 1000ML POUR BTL (IV SOLUTION) ×3 IMPLANT
OIL CARTRIDGE MAESTRO DRILL (MISCELLANEOUS) ×3
PACK LAMINECTOMY ORTHO (CUSTOM PROCEDURE TRAY) ×3 IMPLANT
PACK UNIVERSAL I (CUSTOM PROCEDURE TRAY) ×3 IMPLANT
PAD ARMBOARD 7.5X6 YLW CONV (MISCELLANEOUS) ×6 IMPLANT
PATTIES SURGICAL .5 X.5 (GAUZE/BANDAGES/DRESSINGS) IMPLANT
PATTIES SURGICAL .5 X1 (DISPOSABLE) ×3 IMPLANT
SPONGE INTESTINAL PEANUT (DISPOSABLE) ×3 IMPLANT
SPONGE SURGIFOAM ABS GEL SZ50 (HEMOSTASIS) ×3 IMPLANT
STRIP CLOSURE SKIN 1/2X4 (GAUZE/BANDAGES/DRESSINGS) IMPLANT
SURGIFLO W/THROMBIN 8M KIT (HEMOSTASIS) ×3 IMPLANT
SUT MNCRL AB 4-0 PS2 18 (SUTURE) ×3 IMPLANT
SUT VIC AB 0 CT1 18XCR BRD 8 (SUTURE) IMPLANT
SUT VIC AB 0 CT1 8-18 (SUTURE)
SUT VIC AB 1 CT1 18XCR BRD 8 (SUTURE) ×1 IMPLANT
SUT VIC AB 1 CT1 8-18 (SUTURE) ×2
SUT VIC AB 2-0 CT2 18 VCP726D (SUTURE) ×3 IMPLANT
SYR 20ML LL LF (SYRINGE) ×3 IMPLANT
SYR BULB IRRIG 60ML STRL (SYRINGE) ×3 IMPLANT
SYR CONTROL 10ML LL (SYRINGE) ×6 IMPLANT
SYR TB 1ML 25GX5/8 (SYRINGE) ×6 IMPLANT
SYR TB 1ML LUER SLIP (SYRINGE) ×6 IMPLANT
TAPE CLOTH SURG 4X10 WHT LF (GAUZE/BANDAGES/DRESSINGS) ×3 IMPLANT
TOWEL GREEN STERILE (TOWEL DISPOSABLE) ×3 IMPLANT
TOWEL GREEN STERILE FF (TOWEL DISPOSABLE) ×3 IMPLANT
WATER STERILE IRR 1000ML POUR (IV SOLUTION) ×3 IMPLANT
YANKAUER SUCT BULB TIP NO VENT (SUCTIONS) ×3 IMPLANT

## 2020-12-25 NOTE — H&P (Signed)
PREOPERATIVE H&P  Chief Complaint: Left leg pain  HPI: Dale Conley is a 45 y.o. male who presents with ongoing pain in the left leg  MRI reveals a broad-based left-sided L5-S1 disc protrusion, contacting the traversing left S1 nerve.  Patient has failed multiple forms of conservative care and continues to have pain (see office notes for additional details regarding the patient's full course of treatment)  Past Medical History:  Diagnosis Date  . Sleep apnea    Past Surgical History:  Procedure Laterality Date  . TRIGGER FINGER RELEASE Right 04/11/2020   Procedure: RELEASE TRIGGER FINGER/A-1 PULLEY RIGHT MIDDLE FINGER;  Surgeon: Cindee Salt, MD;  Location: [facility];  Service: Orthopedics;  Laterality: Right;  [facility]   Social History   Socioeconomic History  . Marital status: Single    Spouse name: Not on file  . Number of children: Not on file  . Years of education: Not on file  . Highest education level: Not on file  Occupational History  . Not on file  Tobacco Use  . Smoking status: Never Smoker  . Smokeless tobacco: Never Used  Vaping Use  . Vaping Use: Every day  . Devices: SRX  Substance and Sexual Activity  . Alcohol use: Yes    Alcohol/week: 14.0 standard drinks    Types: 14 Standard drinks or equivalent per week    Comment: beer  . Drug use: No  . Sexual activity: Yes  Other Topics Concern  . Not on file  Social History Narrative  . Not on file   Social Determinants of Health   Financial Resource Strain: Not on file  Food Insecurity: Not on file  Transportation Needs: Not on file  Physical Activity: Not on file  Stress: Not on file  Social Connections: Not on file   Family History  Problem Relation Age of Onset  . Heart disease Father 36       CABG  . Hypertension Father   . Hyperlipidemia Father   . Diabetes Paternal Grandfather    Allergies  Allergen Reactions  . Flexeril [Cyclobenzaprine] Other  (See Comments)    Makes pt groggy   Prior to Admission medications   Medication Sig Start Date End Date Taking? Authorizing Provider  meloxicam (MOBIC) 15 MG tablet Take 1/2 to 1 tablet Daily with Food for Pain & Inflammation Patient taking differently: Take 15 mg by mouth daily. 04/23/20  Yes Lucky Cowboy, MD  methocarbamol (ROBAXIN) 500 MG tablet TAKE 1 TABLET(500 MG) BY MOUTH TWICE DAILY AS NEEDED FOR MUSCLE SPASMS Patient taking differently: Take 500 mg by mouth 2 (two) times daily as needed for muscle spasms. 12/02/20  Yes McClanahan, Bella Kennedy, NP  Vitamin D, Ergocalciferol, (DRISDOL) 1.25 MG (50000 UNIT) CAPS capsule TAKE 1 CAPSULE BY MOUTH 2 DAYS PER WEEK Patient taking differently: Take 50,000 Units by mouth 2 (two) times a week. 04/13/20  Yes Judd Gaudier, NP  dexamethasone (DECADRON) 4 MG tablet Take 1 tab 3 x day - 3 days, then 2 x day - 3 days, then 1 tab daily Patient not taking: No sig reported 09/24/20   Elder Negus, NP  nabumetone (RELAFEN) 750 MG tablet TAKE 1 TABLET(750 MG) BY MOUTH TWICE DAILY AS NEEDED Patient not taking: No sig reported 11/06/20   Hilts, Michael, MD     All other systems have been reviewed and were otherwise negative with the exception of those mentioned in the HPI and as above.  Physical  Exam: Vitals:   12/25/20 0855  BP: (!) 126/92  Pulse: 87  Resp: 20  Temp: 97.7 F (36.5 C)  SpO2: 94%    Body mass index is 28.27 kg/m.  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  Did have an extensive conversation with the patient prior to surgery discussing this finding with him.  I did let him know that this does suggest that his pain may lessen over time, as the degree of nerve compression has lessened.  However, he did express that he continue to have significant pain in his left leg, and that he did wish to proceed with surgery.)   Jackelyn Hoehn, MD 12/25/2020 10:59 AM

## 2020-12-25 NOTE — Transfer of Care (Signed)
Immediate Anesthesia Transfer of Care Note  Patient: Dale Conley  Procedure(s) Performed: LEFT-SIDED LUMBAR 5 - SACRUM 1 MICRODISECTOMY (Left )  Patient Location: PACU  Anesthesia Type:General  Level of Consciousness: drowsy, patient cooperative and responds to stimulation  Airway & Oxygen Therapy: Patient Spontanous Breathing and Patient connected to face mask oxygen  Post-op Assessment: Report given to RN and Post -op Vital signs reviewed and stable  Post vital signs: Reviewed and stable  Last Vitals:  Vitals Value Taken Time  BP 144/96 12/25/20 1421  Temp    Pulse 93 12/25/20 1424  Resp 20 12/25/20 1424  SpO2 99 % 12/25/20 1424  Vitals shown include unvalidated device data.  Last Pain:  Vitals:   12/25/20 0909  TempSrc:   PainSc: 3       Patients Stated Pain Goal: 3 (12/25/20 0909)  Complications: No complications documented.

## 2020-12-25 NOTE — Anesthesia Procedure Notes (Deleted)
Procedure Name: Intubation Performed by: Soniya Ashraf, CRNA Pre-anesthesia Checklist: Patient identified, Patient being monitored, Timeout performed, Emergency Drugs available and Suction available Patient Re-evaluated:Patient Re-evaluated prior to induction Oxygen Delivery Method: Circle System Utilized Preoxygenation: Pre-oxygenation with 100% oxygen Induction Type: IV induction Ventilation: Mask ventilation without difficulty Laryngoscope Size: Miller and 2 Grade View: Grade I Tube type: Oral Tube size: 7.0 mm Number of attempts: 1 Airway Equipment and Method: Stylet Placement Confirmation: ETT inserted through vocal cords under direct vision,  positive ETCO2 and breath sounds checked- equal and bilateral Secured at: 21 cm Tube secured with: Tape Dental Injury: Teeth and Oropharynx as per pre-operative assessment        

## 2020-12-25 NOTE — Anesthesia Postprocedure Evaluation (Signed)
Anesthesia Post Note  Patient: Dale Conley  Procedure(s) Performed: LEFT-SIDED LUMBAR 5 - SACRUM 1 MICRODISECTOMY (Left )     Patient location during evaluation: PACU Anesthesia Type: General Level of consciousness: awake and alert, oriented and awake Pain management: pain level controlled Vital Signs Assessment: post-procedure vital signs reviewed and stable Respiratory status: spontaneous breathing, nonlabored ventilation and respiratory function stable Cardiovascular status: blood pressure returned to baseline and stable Postop Assessment: no apparent nausea or vomiting Anesthetic complications: no   No complications documented.  Last Vitals:  Vitals:   12/25/20 1451 12/25/20 1511  BP: (!) 127/100 (!) 137/95  Pulse: 85 72  Resp: 17 16  Temp:  36.7 C  SpO2: 94% 96%    Last Pain:  Vitals:   12/25/20 1451  TempSrc:   PainSc: 3                  Cecile Hearing

## 2020-12-25 NOTE — Op Note (Signed)
PATIENT NAME: Dale Conley   MEDICAL RECORD NO.:   [id number]    DATE OF BIRTH: August 05, 1976   DATE OF PROCEDURE: 12/25/2020                               OPERATIVE REPORT     PREOPERATIVE DIAGNOSES: 1. Left-sided S1 radiculopathy. 2. Left-sided L5-S1 disk herniation causing left S1 radiculopathy.   POSTOPERATIVE DIAGNOSES: 1. Left-sided S1 radiculopathy. 2. Left-sided L5-S1 disk herniation causing left S1 radiculopathy.   PROCEDURES:  Left-sided L5-S1 laminotomy with partial facetectomy and removal of left-sided L5-S1 disk fragment.   SURGEON:  Estill Bamberg, MD.   ASSISTANTJason Coop, PA-C.   ANESTHESIA:  General endotracheal anesthesia.   COMPLICATIONS:  None.   DISPOSITION:  Stable.   ESTIMATED BLOOD LOSS:  Minimal.   INDICATIONS FOR SURGERY:  Briefly, Mr. Dettore is a pleasant 45 year old male, who did present to me with severe pain in the left leg.  The patient's MRI did reveal the findings outlined above. The pain was rather severe, and did limit his function substantially.  We did discuss treatment options and we did ultimately elect to proceed with the procedure reflected above.  The patient was fully made aware of the risks of surgery, including the risk of recurrent herniation and the need for subsequent surgery, including the possibility of a subsequent diskectomy and/or fusion.  Of note, the patient's updated MRI did reveal a decrease in size of his herniation.  We did discuss this extensively prior to today's surgery.  I did let the patient know that his pain may decrease in time without the need for surgery, but the patient did wish to proceed with the surgery noted above.   OPERATIVE DETAILS:  On 12/25/2020, the patient was brought to surgery and general endotracheal anesthesia was administered.  The patient was placed prone on a well-padded flat Jackson bed with a spinal frame.  Antibiotics were given.  The back was prepped and draped and a time-out  procedure was performed.  At this point, a midline incision was made directly over the L5-S1 intervertebral space.  A curvilinear incision was made just to the left of the midline into the fascia.  A self-retaining McCulloch retractor was placed.  The lamina of L5 and S1 was identified and subperiosteally exposed.  I then removed the lateral aspect of the L5-S1 ligamentum flavum.  Readily identified was the traversing left S1 nerve, which was noted to be slightly compressed.  I was able to gently gain medial retraction of the nerve, and in doing so, a disc protrusion was identified beneath.   At this point, an annulotomy was performed, and the protruded disc material immediately ventral to the annulus was removed using a straight and upward angled micropituitary.  Small free disc fragments were removed, thereby decompressing the traversing left S1 nerve. I was very pleased with the final decompression that I was able to accomplish.  At this point, the wound was copiously irrigated with normal saline.  All epidural bleeding was controlled using bipolar electrocautery in addition to Surgiflo. All bleeding was controlled at the termination of the procedure.  At this point, 20 mg of Depo-Medrol was introduced about the epidural space in the region of the left S1 nerve.  The wound was then closed in layers using #1 Vicryl followed by 0 Vicryl, followed by 4-0 Monocryl. Benzoin and Steri-Strips were applied followed by a sterile dressing. All instrument  counts were correct at the termination of the procedure.   Of note, Jason Coop was my assistant throughout surgery, and did aid in retraction, suctioning, and closure from start to finish.       Estill Bamberg, MD

## 2020-12-25 NOTE — Anesthesia Preprocedure Evaluation (Signed)
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  Reproductive/Obstetrics                             Anesthesia Physical Anesthesia Plan  ASA: II  Anesthesia Plan: General   Post-op Pain Management:    Induction: Intravenous  PONV Risk Score and Plan: 3 and Midazolam and Ondansetron  Airway Management Planned: Oral ETT  Additional Equipment:   Intra-op Plan:   Post-operative Plan: Extubation in OR  Informed Consent: I have reviewed the patients History and Physical, chart, labs and discussed the procedure including the risks, benefits and alternatives for the proposed anesthesia with the patient or authorized representative who has indicated his/her understanding and acceptance.     Dental advisory given  Plan Discussed with: CRNA  Anesthesia Plan Comments:         Anesthesia Quick Evaluation

## 2020-12-25 NOTE — Anesthesia Procedure Notes (Signed)
Procedure Name: Intubation Date/Time: 12/25/2020 12:05 PM Performed by: Shary Decamp, CRNA Pre-anesthesia Checklist: Patient identified, Patient being monitored, Timeout performed, Emergency Drugs available and Suction available Patient Re-evaluated:Patient Re-evaluated prior to induction Oxygen Delivery Method: Circle System Utilized Preoxygenation: Pre-oxygenation with 100% oxygen Induction Type: IV induction Ventilation: Mask ventilation without difficulty Laryngoscope Size: Miller and 2 Grade View: Grade I Tube type: Oral Tube size: 7.5 mm Number of attempts: 1 Airway Equipment and Method: Stylet Placement Confirmation: ETT inserted through vocal cords under direct vision,  positive ETCO2 and breath sounds checked- equal and bilateral Secured at: 22 cm Tube secured with: Tape Dental Injury: Teeth and Oropharynx as per pre-operative assessment

## 2020-12-26 ENCOUNTER — Encounter (HOSPITAL_COMMUNITY): Payer: Self-pay | Admitting: Orthopedic Surgery

## 2020-12-26 MED FILL — Thrombin (Recombinant) For Soln 20000 Unit: CUTANEOUS | Qty: 1 | Status: AC

## 2021-01-31 ENCOUNTER — Other Ambulatory Visit: Payer: Self-pay | Admitting: Internal Medicine

## 2021-01-31 DIAGNOSIS — M25511 Pain in right shoulder: Secondary | ICD-10-CM

## 2021-01-31 MED ORDER — MELOXICAM 15 MG PO TABS: ORAL_TABLET | ORAL | 3 refills | Status: DC

## 2021-04-01 ENCOUNTER — Other Ambulatory Visit: Payer: Self-pay | Admitting: Adult Health

## 2021-04-01 DIAGNOSIS — E559 Vitamin D deficiency, unspecified: Secondary | ICD-10-CM

## 2021-04-16 DIAGNOSIS — M65351 Trigger finger, right little finger: Secondary | ICD-10-CM | POA: Diagnosis not present

## 2021-05-06 DIAGNOSIS — B360 Pityriasis versicolor: Secondary | ICD-10-CM | POA: Diagnosis not present

## 2021-06-02 ENCOUNTER — Other Ambulatory Visit: Payer: Self-pay | Admitting: Internal Medicine

## 2021-06-02 DIAGNOSIS — M25511 Pain in right shoulder: Secondary | ICD-10-CM

## 2021-06-02 MED ORDER — MELOXICAM 15 MG PO TABS
ORAL_TABLET | ORAL | 3 refills | Status: DC
Start: 2021-06-02 — End: 2023-01-11

## 2021-06-12 DIAGNOSIS — B360 Pityriasis versicolor: Secondary | ICD-10-CM | POA: Diagnosis not present

## 2021-06-19 NOTE — Progress Notes (Signed)
FOLLOW UP  Assessment and Plan:   Maddyx was seen today for migraine.  Diagnoses and all orders for this visit:  Essential hypertension -     CBC with Differential/Platelet -  DASH diet, exercise and monitor at home. Call if greater than 130/80.    Mixed hyperlipidemia -     COMPLETE METABOLIC PANEL WITH GFR -     Lipid panel -     TSH - Stressed importance of diet and exercise  Overweight (BMI 25.0-29.9)  Continue diet and exercise   Migraine without aura and without status migrainosus, not intractable -     rizatriptan (MAXALT) 10 MG tablet; Take 1 tablet (10 mg total) by mouth as needed for migraine. May repeat in 2 hours if needed Monitor migraines, if not controlled with triptan and having more than 8 migraines a month will consider switching to [employer] and meds as discussed. Further disposition pending results of labs. Discussed med's effects and SE's.   Over 30 minutes of exam, counseling, chart review, and critical decision making was performed.   Future Appointments  Date Time Provider Department Center  09/24/2021  3:00 PM Revonda Humphrey, NP [facility] None    ----------------------------------------------------------------------------------------------------------------------  HPI 45 y.o. male  presents for 3 month follow up on hypertension, cholesterol,  weight and vitamin D deficiency. Also having migraines  He is having headaches that occur in frontal region and behind eyes. He takes Excedrin migraine and will help for short time and then comes right back.Headaches daily with migraines 2-3 times a week. Has light sensitivity. No food triggers.  BMI is Body mass index is 26.89 kg/m., he has been working on diet and exercise. Wt Readings from Last 3 Encounters:  06/20/21 187 lb 6.4 oz (85 kg)  12/25/20 197 lb (89.4 kg)  12/23/20 197 lb (89.4 kg)    His blood pressure has been controlled at home, today their BP is BP: 120/82 BP  Readings from Last 3 Encounters:  06/20/21 120/82  12/25/20 (!) 137/95  12/23/20 (!) 139/104     He does workout. He denies chest pain, shortness of breath, dizziness.   He is not on cholesterol medication   His cholesterol is not at goal. The cholesterol last visit was:   Lab Results  Component Value Date   CHOL 235 (H) 09/24/2020   HDL 70 09/24/2020   LDLCALC 131 (H) 09/24/2020   TRIG 202 (H) 09/24/2020   CHOLHDL 3.4 09/24/2020     Patient is on Vitamin D supplement.   Lab Results  Component Value Date   VD25OH 68 09/24/2020        Current Medications:  Current Outpatient Medications on File Prior to Visit  Medication Sig   aspirin-acetaminophen-caffeine (EXCEDRIN MIGRAINE) 250-250-65 MG tablet Take by mouth every 6 (six) hours as needed for headache. PRN   meloxicam (MOBIC) 15 MG tablet Take 1/2 to 1 tablet Daily with Food for Pain & Inflammation   methocarbamol (ROBAXIN) 500 MG tablet TAKE 1-2 TABLETS BY MOUTH Q6HRS AS NEEDED FOR MUSCLE SPASMS   Vitamin D, Ergocalciferol, (DRISDOL) 1.25 MG (50000 UNIT) CAPS capsule TAKE 1 CAPSULE BY MOUTH 2 DAYS PER WEEK   No current facility-administered medications on file prior to visit.     Allergies:  Allergies  Allergen Reactions   Flexeril [Cyclobenzaprine] Other (See Comments)    Makes pt groggy     Medical History:  Past Medical History:  Diagnosis Date  Sleep apnea    Family history- Reviewed and unchanged Social history- Reviewed and unchanged   Review of Systems:  Review of Systems  Constitutional:  Negative for chills and fever.  HENT:  Positive for tinnitus. Negative for congestion, hearing loss, sinus pain and sore throat.   Eyes:  Positive for blurred vision (Occ with migraines).  Respiratory:  Negative for cough and hemoptysis.   Cardiovascular:  Negative for chest pain, palpitations and leg swelling.  Gastrointestinal:  Negative for abdominal pain, diarrhea, heartburn, nausea and vomiting.   Genitourinary:  Negative for dysuria.  Musculoskeletal:  Positive for back pain (rare).  Neurological:  Positive for headaches. Negative for dizziness.  Psychiatric/Behavioral:  Negative for depression. The patient does not have insomnia.      Physical Exam: BP 120/82   Pulse 72   Temp (!) 97.5 F (36.4 C)   Wt 187 lb 6.4 oz (85 kg)   SpO2 98%   BMI 26.89 kg/m  Wt Readings from Last 3 Encounters:  06/20/21 187 lb 6.4 oz (85 kg)  12/25/20 197 lb (89.4 kg)  12/23/20 197 lb (89.4 kg)   General Appearance: Well nourished, in no apparent distress. Eyes: PERRLA, EOMs, conjunctiva no swelling or erythema Sinuses: No Frontal/maxillary tenderness ENT/Mouth: Ext aud canals clear, TMs without erythema, bulging. No erythema, swelling, or exudate on post pharynx.  Tonsils not swollen or erythematous. Hearing normal.  Neck: Supple, thyroid normal.  Respiratory: Respiratory effort normal, BS equal bilaterally without rales, rhonchi, wheezing or stridor.  Cardio: RRR with no MRGs. Brisk peripheral pulses without edema.  Abdomen: Soft, + BS.  Non tender, no guarding, rebound, hernias, masses. Lymphatics: Non tender without lymphadenopathy.  Musculoskeletal: Full ROM, 5/5 strength, Normal gait Skin: Warm, dry without rashes, lesions, ecchymosis.  Neuro: Cranial nerves intact. No cerebellar symptoms.  Psych: Awake and oriented X 3, normal affect, Insight and Judgment appropriate.    Revonda Humphrey, NP 9:35 AM [facility]

## 2021-06-20 ENCOUNTER — Encounter: Payer: Self-pay | Admitting: Nurse Practitioner

## 2021-06-20 ENCOUNTER — Other Ambulatory Visit: Payer: Self-pay

## 2021-06-20 ENCOUNTER — Ambulatory Visit: Payer: BC Managed Care – PPO | Admitting: Nurse Practitioner

## 2021-06-20 VITALS — BP 120/82 | HR 72 | Temp 97.5°F | Wt 187.4 lb

## 2021-06-20 DIAGNOSIS — G4733 Obstructive sleep apnea (adult) (pediatric): Secondary | ICD-10-CM

## 2021-06-20 DIAGNOSIS — E663 Overweight: Secondary | ICD-10-CM | POA: Diagnosis not present

## 2021-06-20 DIAGNOSIS — E782 Mixed hyperlipidemia: Secondary | ICD-10-CM | POA: Diagnosis not present

## 2021-06-20 DIAGNOSIS — Z79899 Other long term (current) drug therapy: Secondary | ICD-10-CM | POA: Diagnosis not present

## 2021-06-20 DIAGNOSIS — I10 Essential (primary) hypertension: Secondary | ICD-10-CM

## 2021-06-20 DIAGNOSIS — G43009 Migraine without aura, not intractable, without status migrainosus: Secondary | ICD-10-CM

## 2021-06-20 MED ORDER — RIZATRIPTAN BENZOATE 10 MG PO TABS: 10.0000 mg | ORAL_TABLET | ORAL | 3 refills | Status: DC | PRN

## 2021-06-20 NOTE — Patient Instructions (Signed)
Migraine Headache A migraine headache is an intense, throbbing pain on one side or both sides of the head. Migraine headaches may also cause other symptoms, such as nausea, vomiting, and sensitivity to light and noise. A migraine headache can last from 4 hours to 3 days. Talk with your doctor about what things may bring on (trigger) your migraine headaches. What are the causes? The exact cause of this condition is not known. However, a migraine may be caused when nerves in the brain become irritated and release chemicals that cause inflammation of blood vessels. This inflammation causes pain. This condition may be triggered or caused by: Drinking alcohol. Smoking. Taking medicines, such as: Medicine used to treat chest pain (nitroglycerin). Birth control pills. Estrogen. Certain blood pressure medicines. Eating or drinking products that contain nitrates, glutamate, aspartame, or tyramine. Aged cheeses, chocolate, or caffeine may also be triggers. Doing physical activity. Other things that may trigger a migraine headache include: Menstruation. Pregnancy. Hunger. Stress. Lack of sleep or too much sleep. Weather changes. Fatigue. What increases the risk? The following factors may make you more likely to experience migraine headaches: Being a certain age. This condition is more common in people who are 25-55 years old. Being male. Having a family history of migraine headaches. Being Caucasian. Having a mental health condition, such as depression or anxiety. Being obese. What are the signs or symptoms? The main symptom of this condition is pulsating or throbbing pain. This pain may: Happen in any area of the head, such as on one side or both sides. Interfere with daily activities. Get worse with physical activity. Get worse with exposure to bright lights or loud noises. Other symptoms may include: Nausea. Vomiting. Dizziness. General sensitivity to bright lights, loud noises, or  smells. Before you get a migraine headache, you may get warning signs (an aura). An aura may include: Seeing flashing lights or having blind spots. Seeing bright spots, halos, or zigzag lines. Having tunnel vision or blurred vision. Having numbness or a tingling feeling. Having trouble talking. Having muscle weakness. Some people have symptoms after a migraine headache (postdromal phase), such as: Feeling tired. Difficulty concentrating. How is this diagnosed? A migraine headache can be diagnosed based on: Your symptoms. A physical exam. Tests, such as: CT scan or an MRI of the head. These imaging tests can help rule out other causes of headaches. Taking fluid from the spine (lumbar puncture) and analyzing it (cerebrospinal fluid analysis, or CSF analysis). How is this treated? This condition may be treated with medicines that: Relieve pain. Relieve nausea. Prevent migraine headaches. Treatment for this condition may also include: Acupuncture. Lifestyle changes like avoiding foods that trigger migraine headaches. Biofeedback. Cognitive behavioral therapy. Follow these instructions at home: Medicines Take over-the-counter and prescription medicines only as told by your health care provider. Ask your health care provider if the medicine prescribed to you: Requires you to avoid driving or using heavy machinery. Can cause constipation. You may need to take these actions to prevent or treat constipation: Drink enough fluid to keep your urine pale yellow. Take over-the-counter or prescription medicines. Eat foods that are high in fiber, such as beans, whole grains, and fresh fruits and vegetables. Limit foods that are high in fat and processed sugars, such as fried or sweet foods. Lifestyle Do not drink alcohol. Do not use any products that contain nicotine or tobacco, such as cigarettes, e-cigarettes, and chewing tobacco. If you need help quitting, ask your health care  provider. Get at least 8   hours of sleep every night. Find ways to manage stress, such as meditation, deep breathing, or yoga. General instructions   Keep a journal to find out what may trigger your migraine headaches. For example, write down: What you eat and drink. How much sleep you get. Any change to your diet or medicines. If you have a migraine headache: Avoid things that make your symptoms worse, such as bright lights. It may help to lie down in a dark, quiet room. Do not drive or use heavy machinery. Ask your health care provider what activities are safe for you while you are experiencing symptoms. Keep all follow-up visits as told by your health care provider. This is important. Contact a health care provider if: You develop symptoms that are different or more severe than your usual migraine headache symptoms. You have more than 15 headache days in one month. Get help right away if: Your migraine headache becomes severe. Your migraine headache lasts longer than 72 hours. You have a fever. You have a stiff neck. You have vision loss. Your muscles feel weak or like you cannot control them. You start to lose your balance often. You have trouble walking. You faint. You have a seizure. Summary A migraine headache is an intense, throbbing pain on one side or both sides of the head. Migraines may also cause other symptoms, such as nausea, vomiting, and sensitivity to light and noise. This condition may be treated with medicines and lifestyle changes. You may also need to avoid certain things that trigger a migraine headache. Keep a journal to find out what may trigger your migraine headaches. Contact your health care provider if you have more than 15 headache days in a month or you develop symptoms that are different or more severe than your usual migraine headache symptoms. This information is not intended to replace advice given to you by your health care provider. Make sure you  discuss any questions you have with your health care provider. Document Revised: 01/06/2019 Document Reviewed: 10/27/2018 Elsevier Patient Education  2022 [employer]  

## 2021-06-21 LAB — COMPLETE METABOLIC PANEL WITH GFR
AG Ratio: 1.7 (calc) (ref 1.0–2.5)
ALT: 19 U/L (ref 9–46)
AST: 19 U/L (ref 10–40)
Albumin: 4.5 g/dL (ref 3.6–5.1)
Alkaline phosphatase (APISO): 60 U/L (ref 36–130)
BUN: 16 mg/dL (ref 7–25)
CO2: 24 mmol/L (ref 20–32)
Calcium: 10.5 mg/dL — ABNORMAL HIGH (ref 8.6–10.3)
Chloride: 105 mmol/L (ref 98–110)
Creat: 1.04 mg/dL (ref 0.60–1.29)
Globulin: 2.7 g/dL (calc) (ref 1.9–3.7)
Glucose, Bld: 104 mg/dL — ABNORMAL HIGH (ref 65–99)
Potassium: 4.1 mmol/L (ref 3.5–5.3)
Sodium: 139 mmol/L (ref 135–146)
Total Bilirubin: 0.6 mg/dL (ref 0.2–1.2)
Total Protein: 7.2 g/dL (ref 6.1–8.1)
eGFR: 91 mL/min/{1.73_m2} (ref >=60)

## 2021-06-21 LAB — CBC WITH DIFFERENTIAL/PLATELET
Absolute Monocytes: 711 cells/uL (ref 200–950)
Basophils Absolute: 48 cells/uL (ref 0–200)
Basophils Relative: 0.7 %
Eosinophils Absolute: 200 cells/uL (ref 15–500)
Eosinophils Relative: 2.9 %
HCT: 48.6 % (ref 38.5–50.0)
Hemoglobin: 16.4 g/dL (ref 13.2–17.1)
Lymphs Abs: 2015 cells/uL (ref 850–3900)
MCH: 32.5 pg (ref 27.0–33.0)
MCHC: 33.7 g/dL (ref 32.0–36.0)
MCV: 96.4 fL (ref 80.0–100.0)
MPV: 9.8 fL (ref 7.5–12.5)
Monocytes Relative: 10.3 %
Neutro Abs: 3926 cells/uL (ref 1500–7800)
Neutrophils Relative %: 56.9 %
Platelets: 257 10*3/uL (ref 140–400)
RBC: 5.04 10*6/uL (ref 4.20–5.80)
RDW: 11.9 % (ref 11.0–15.0)
Total Lymphocyte: 29.2 %
WBC: 6.9 10*3/uL (ref 3.8–10.8)

## 2021-06-21 LAB — LIPID PANEL
Cholesterol: 244 mg/dL — ABNORMAL HIGH (ref <200)
HDL: 70 mg/dL (ref >=40)
LDL Cholesterol (Calc): 145 mg/dL (calc) — ABNORMAL HIGH
Non-HDL Cholesterol (Calc): 174 mg/dL (calc) — ABNORMAL HIGH (ref <130)
Total CHOL/HDL Ratio: 3.5 (calc) (ref <5.0)
Triglycerides: 157 mg/dL — ABNORMAL HIGH (ref <150)

## 2021-06-21 LAB — TSH: TSH: 1.41 mIU/L (ref 0.40–4.50)

## 2021-07-16 ENCOUNTER — Telehealth: Payer: Self-pay

## 2021-07-16 ENCOUNTER — Other Ambulatory Visit: Payer: Self-pay | Admitting: Nurse Practitioner

## 2021-07-16 DIAGNOSIS — G43009 Migraine without aura, not intractable, without status migrainosus: Secondary | ICD-10-CM

## 2021-07-16 MED ORDER — NURTEC 75 MG PO TBDP: ORAL_TABLET | ORAL | 2 refills | Status: DC

## 2021-07-16 NOTE — Telephone Encounter (Signed)
Patient is saying that the rizatriptan is not working. He said you would give him a different medication if that one was not helping with migraines.

## 2021-07-16 NOTE — Telephone Encounter (Signed)
We can try Nurtec. I will send to pharmacy but will most likely need a prior authorization.

## 2021-07-31 ENCOUNTER — Other Ambulatory Visit: Payer: Self-pay | Admitting: Nurse Practitioner

## 2021-07-31 DIAGNOSIS — G43009 Migraine without aura, not intractable, without status migrainosus: Secondary | ICD-10-CM

## 2021-07-31 MED ORDER — TOPIRAMATE 25 MG PO TABS
25.0000 mg | ORAL_TABLET | Freq: Every day | ORAL | 2 refills | Status: DC
Start: 2021-07-31 — End: 2021-09-24

## 2021-09-16 ENCOUNTER — Ambulatory Visit (INDEPENDENT_AMBULATORY_CARE_PROVIDER_SITE_OTHER): Payer: BC Managed Care – PPO | Admitting: Adult Health

## 2021-09-16 ENCOUNTER — Other Ambulatory Visit: Payer: Self-pay

## 2021-09-16 ENCOUNTER — Encounter: Payer: Self-pay | Admitting: Adult Health

## 2021-09-16 VITALS — BP 120/80 | HR 76 | Temp 97.3°F | Wt 199.0 lb

## 2021-09-16 DIAGNOSIS — R6889 Other general symptoms and signs: Secondary | ICD-10-CM | POA: Diagnosis not present

## 2021-09-16 DIAGNOSIS — Z1152 Encounter for screening for COVID-19: Secondary | ICD-10-CM

## 2021-09-16 DIAGNOSIS — J029 Acute pharyngitis, unspecified: Secondary | ICD-10-CM | POA: Diagnosis not present

## 2021-09-16 LAB — POCT INFLUENZA A/B
Influenza A, POC: NEGATIVE
Influenza B, POC: NEGATIVE

## 2021-09-16 LAB — POC COVID19 BINAXNOW: SARS Coronavirus 2 Ag: NEGATIVE

## 2021-09-16 MED ORDER — PREDNISONE 20 MG PO TABS: ORAL_TABLET | ORAL | 0 refills | Status: DC

## 2021-09-16 MED ORDER — BENZONATATE 200 MG PO CAPS: ORAL_CAPSULE | ORAL | 1 refills | Status: DC

## 2021-09-16 NOTE — Patient Instructions (Signed)
-  Make sure you are drinking plenty of fluids to stay hydrated.  -while drinking fluids pinch and hold nose close and swallow, to help open eustachian tubes and drain fluid behind ear drums. -you can do salt water gargles. You can also do 1 TSP liquid Maalox and 1 TSP liquid benadryl- mix/ gargle/ spit  If you are not feeling better in 10-14 days, then please call the office.  Pharyngitis Pharyngitis is redness, pain, and swelling (inflammation) of your pharynx.  CAUSES  Pharyngitis is usually caused by infection. Most of the time, these infections are from viruses (viral) and are part of a cold. However, sometimes pharyngitis is caused by bacteria (bacterial). Pharyngitis can also be caused by allergies. Viral pharyngitis may be spread from person to person by coughing, sneezing, and personal items or utensils (cups, forks, spoons, toothbrushes). Bacterial pharyngitis may be spread from person to person by more intimate contact, such as kissing.  SIGNS AND SYMPTOMS  Symptoms of pharyngitis include:   Sore throat.   Tiredness (fatigue).   Low-grade fever.   Headache.  Joint pain and muscle aches.  Skin rashes.  Swollen lymph nodes.  Plaque-like film on throat or tonsils (often seen with bacterial pharyngitis). DIAGNOSIS  Your health care provider will ask you questions about your illness and your symptoms. Your medical history, along with a physical exam, is often all that is needed to diagnose pharyngitis. Sometimes, a rapid strep test is done. Other lab tests may also be done, depending on the suspected cause.  TREATMENT  Viral pharyngitis will usually get better in 3-4 days without the use of medicine. Bacterial pharyngitis is treated with medicines that kill germs (antibiotics).  HOME CARE INSTRUCTIONS   Drink enough water and fluids to keep your urine clear or pale yellow.   Only take over-the-counter or prescription medicines as directed by your health care  provider:   If you are prescribed antibiotics, make sure you finish them even if you start to feel better.   Do not take aspirin.   Get lots of rest.   Gargle with 8 oz of salt water ( tsp of salt per 1 qt of water) as often as every 1-2 hours to soothe your throat.   Throat lozenges (if you are not at risk for choking) or sprays may be used to soothe your throat. SEEK MEDICAL CARE IF:   You have large, tender lumps in your neck.  You have a rash.  You cough up green, yellow-brown, or bloody spit. SEEK IMMEDIATE MEDICAL CARE IF:   Your neck becomes stiff.  You drool or are unable to swallow liquids.  You vomit or are unable to keep medicines or liquids down.  You have severe pain that does not go away with the use of recommended medicines.  You have trouble breathing (not caused by a stuffy nose). MAKE SURE YOU:   Understand these instructions.  Will watch your condition.  Will get help right away if you are not doing well or get worse. Document Released: 09/14/2005 Document Revised: 07/05/2013 Document Reviewed: 05/22/2013 Exit[employer] Patient Information 2015 [employer]. This information is not intended to replace advice given to you by your health care provider. Make sure you discuss any questions you have with your health care provider.  

## 2021-09-16 NOTE — Progress Notes (Signed)
Assessment and Plan:  Dale Conley was seen today for acute visit.  Diagnoses and all orders for this visit:  Acute pharyngitis, unspecified etiology Benign exam, not suggestive of strep or mono Recommended voice rest, push hydration, regular salt water gargles, throat sprays PRN, sugar free throat lozenges He is requesting steroid taper which has helped in the past - prednisone sent in  Discussed most likely viral, typical course If symptoms do not improve in 5-7 days or get worse (fever/chills, HA, swollen lymph nodes) contact the office or go to ER if any drooling or distress.  -     predniSONE (DELTASONE) 20 MG tablet; 2 tablets daily for 3 days, 1 tablet daily for 4 days. -     benzonatate (TESSALON) 200 MG capsule; Take 1 tab three times a day as needed for cough.  Flu-like symptoms -     POCT Influenza A/B  Encounter for screening for COVID-19 -     POC COVID-19  Further disposition pending results of labs. Discussed med's effects and SE's.   Over 30 minutes of exam, counseling, chart review, and critical decision making was performed.   Future Appointments  Date Time Provider Department Center  09/24/2021  3:00 PM Revonda Humphrey, NP [facility] None    ------------------------------------------------------------------------------------------------------------------   HPI BP 120/80    Pulse 76    Temp (!) 97.3 F (36.3 C)    Wt 199 lb (90.3 kg)    SpO2 97%    BMI 28.55 kg/m  Dale Conley presents for evaluation of sore throat. He was tested for covid 19 and flu and was negative prior to being seen.   He reports started with mild throat scratchiness, sore throat more on L> R, progressive over the weekend, tender lymph nodes, woke up with worse pain this AM and requested evaluation, pain mostly with swallowing, can drink water/fluid ok, not drooling /pooling in mouth. Denies HA, fever/chills, notable cough.   Has done zinc supplement, vitamin C immune boost, has tried mucinex,  tylenol day/flu (does help some). Has been doing ricola with benefit. Hasn't tried throat sprays or gargles.   He notes has similar sx most years about this time, resolves with steroid taper.    Past Medical History:  Diagnosis Date   Sleep apnea      Allergies  Allergen Reactions   Flexeril [Cyclobenzaprine] Other (See Comments)    Makes pt groggy    Current Outpatient Medications on File Prior to Visit  Medication Sig   aspirin-acetaminophen-caffeine (EXCEDRIN MIGRAINE) 250-250-65 MG tablet Take by mouth every 6 (six) hours as needed for headache. PRN   meloxicam (MOBIC) 15 MG tablet Take 1/2 to 1 tablet Daily with Food for Pain & Inflammation (Patient taking differently: as needed. Take 1/2 to 1 tablet Daily with Food for Pain & Inflammation)   methocarbamol (ROBAXIN) 500 MG tablet TAKE 1-2 TABLETS BY MOUTH Q6HRS AS NEEDED FOR MUSCLE SPASMS (Patient taking differently: as needed. TAKE 1-2 TABLETS BY MOUTH Q6HRS AS NEEDED FOR MUSCLE SPASMS)   Rimegepant Sulfate (NURTEC) 75 MG TBDP Take 1 tab at onset of migraine, can repeat in 2 hours if no relief.  No more than 2 tabs in 24 hour period (Patient not taking: Reported on 09/16/2021)   topiramate (TOPAMAX) 25 MG tablet Take 1 tablet (25 mg total) by mouth at bedtime. (Patient not taking: Reported on 09/16/2021)   Vitamin D, Ergocalciferol, (DRISDOL) 1.25 MG (50000 UNIT) CAPS capsule TAKE 1 CAPSULE BY MOUTH 2 DAYS PER WEEK  No current facility-administered medications on file prior to visit.    ROS: all negative except above.   Physical Exam:  BP 120/80    Pulse 76    Temp (!) 97.3 F (36.3 C)    Wt 199 lb (90.3 kg)    SpO2 97%    BMI 28.55 kg/m   General Appearance: Well nourished, in no apparent distress. Eyes: PERRLA, EOMs, conjunctiva no swelling or erythema Sinuses: No Frontal/maxillary tenderness ENT/Mouth: Ext aud canals clear, TMs without erythema, bulging. No erythema, swelling, or exudate on post pharynx.  Tonsils not  swollen or erythematous. Hearing normal.  Neck: Supple, thyroid normal.  Respiratory: Respiratory effort normal, BS equal bilaterally without rales, rhonchi, wheezing or stridor.  Cardio: RRR with no MRGs. Brisk peripheral pulses without edema.  Abdomen: Soft, + BS.  Non tender, no splenomegaly.  Lymphatics: Non tender without lymphadenopathy.  Musculoskeletal: normal gait.  Skin: Warm, dry without rashes, lesions, ecchymosis.  Neuro: Cranial nerves intact. Normal muscle tone Psych: Awake and oriented X 3, normal affect, Insight and Judgment appropriate.     Dan Maker, NP 3:08 PM [facility]

## 2021-09-23 NOTE — Progress Notes (Signed)
Complete Physical  Assessment and Plan:  Dale Conley was seen today for annual exam. Diagnoses and all orders for this visit:  Encounter for general Adult medical Examination with Abnormal Findings Due Annually  Essential hypertension No medications Monitor blood pressure at home; call if consistently over 130/80 Continue DASH diet.   Reminder to go to the ER if any CP, SOB, nausea, dizziness, severe HA, changes vision/speech, left arm numbness and tingling and jaw pain.  Mixed hyperlipidemia No medications Discussed dietary and exercise modifications Low fat diet  Migraine Have improved with decreasing frequency of use of Methocarbomol and Mobic Monitor  Hypogonadism in male No supplementation at this time Exercising more  Overweight (BMI 25.0-29.9) Discussed dietary and exercise modifications  Vitamin D deficiency Continue supplementation Will monitor next routine visit  Medication management Continued  Screening for prostate cancer - PSA  Screening for diabetes mellitus -A1c  Screening, ischemic heart disease -EKG  Screening for blood or protein in urine -Routine urine with Reflex microscopic -Microalbumin/creatinine urine ratio  Screening for thyroid disorder -TSH  Flu Vaccine Need Flu Vaccine Quad 6+ MOS PF IM given  Patient agrees with plan of care.  Discussed when to call or return and hospital precautions. Further disposition pending results of labs. Discussed med's effects and SE's.   Over 30 minutes of face to face interview, exam, counseling, chart review, and critical decision making was performed.   Future Appointments  Date Time Provider Department Center  09/24/2022  2:00 PM Revonda Humphrey, NP [facility] None    ------------------------------------------------------------------------------------------------------------------   HPI 45 y.o.male presents for complete physical. has Back pain; Hypogonadism in male; Essential hypertension;  Vitamin D deficiency; Medication management; Obstructive sleep apnea syndrome; Headache; Overweight (BMI 25.0-29.9); Hyperlipidemia; and Elevated LFTs on their problem list.   Reports he had a back injury. Currently managing pain with Meloxicam and Robaxin as needed.  The Meloxicam and Methocarbomol were triggering migraines.  Headaches are improved with decreasing use of those meds.   He had a sleep study and recommend CPAP from [facility]. He is on the nose piece, can tolerate for about 4 hours a night. Has not been using every night.   BMI is Body mass index is 28.38 kg/m., he has been working on diet and exercise. Weights and cardio.  Wt Readings from Last 3 Encounters:  09/24/21 197 lb 12.8 oz (89.7 kg)  09/16/21 199 lb (90.3 kg)  06/20/21 187 lb 6.4 oz (85 kg)     His blood pressure has been controlled at home, today their BP is BP: 118/82 BP Readings from Last 3 Encounters:  09/24/21 118/82  09/16/21 120/80  06/20/21 120/82     He does workout. He denies chest pain, shortness of breath, dizziness.  He is not on cholesterol medication and denies myalgias. His cholesterol is not at goal. The cholesterol last visit was:   Lab Results  Component Value Date   CHOL 244 (H) 06/20/2021   HDL 70 06/20/2021   LDLCALC 145 (H) 06/20/2021   TRIG 157 (H) 06/20/2021   CHOLHDL 3.5 06/20/2021    He has been working on diet and exercise for prediabetes, and denies polydipsia, polyuria, visual disturbances, vomiting and weight loss. Last A1C in the office was:  Lab Results  Component Value Date   HGBA1C 5.2 09/24/2020   Patient is on Vitamin D supplement.   Lab Results  Component Value Date   VD25OH 89 09/24/2020   He has a history of testosterone deficiency, it not on  anything, states it makes him irritable.  Lab Results  Component Value Date   TESTOSTERONE 238 (L) 09/22/2018     Names of Other Physician/Practitioners you currently use: 1. [facility] here for primary care 2.  Eye Exam: Has not had 3. Dentist: Declines relate dot COVID19  Patient Care Team: Lucky Cowboy, MD as PCP - General (Internal Medicine)    Screening Tests: Immunization History  Administered Date(s) Administered   Influenza Inj Mdck Quad With Preservative 07/19/2019, 09/24/2020   PFIZER(Purple Top)SARS-COV-2 Vaccination 12/22/2019, 01/12/2020, 07/29/2020    Preventative care: Last colonoscopy: N/A   Vaccinations: TD or Tdap: 2015 Influenza: 08/2020 received today Pneumococcal: N/A Prevnar13: N/A  Shingles/Zostavax: N/A  SARs-COV2[place] 07/2020    Past Medical History:  Diagnosis Date   Sleep apnea      Allergies  Allergen Reactions   Flexeril [Cyclobenzaprine] Other (See Comments)    Makes pt groggy    Current Outpatient Medications on File Prior to Visit  Medication Sig   meloxicam (MOBIC) 15 MG tablet Take 1/2 to 1 tablet Daily with Food for Pain & Inflammation (Patient taking differently: as needed. Take 1/2 to 1 tablet Daily with Food for Pain & Inflammation)   methocarbamol (ROBAXIN) 500 MG tablet TAKE 1-2 TABLETS BY MOUTH Q6HRS AS NEEDED FOR MUSCLE SPASMS (Patient taking differently: as needed. TAKE 1-2 TABLETS BY MOUTH Q6HRS AS NEEDED FOR MUSCLE SPASMS)   aspirin-acetaminophen-caffeine (EXCEDRIN MIGRAINE) 250-250-65 MG tablet Take by mouth every 6 (six) hours as needed for headache. PRN (Patient not taking: Reported on 09/24/2021)   benzonatate (TESSALON) 200 MG capsule Take 1 tab three times a day as needed for cough. (Patient not taking: Reported on 09/24/2021)   predniSONE (DELTASONE) 20 MG tablet 2 tablets daily for 3 days, 1 tablet daily for 4 days. (Patient not taking: Reported on 09/24/2021)   Rimegepant Sulfate (NURTEC) 75 MG TBDP Take 1 tab at onset of migraine, can repeat in 2 hours if no relief.  No more than 2 tabs in 24 hour period (Patient not taking: Reported on 09/16/2021)   topiramate (TOPAMAX)  25 MG tablet Take 1 tablet (25 mg total) by mouth at bedtime. (Patient not taking: Reported on 09/16/2021)   Vitamin D, Ergocalciferol, (DRISDOL) 1.25 MG (50000 UNIT) CAPS capsule TAKE 1 CAPSULE BY MOUTH 2 DAYS PER WEEK (Patient not taking: Reported on 09/24/2021)   No current facility-administered medications on file prior to visit.    ROS: Review of Systems  Constitutional:  Negative for chills and fever.  HENT:  Negative for congestion, hearing loss, sinus pain, sore throat and tinnitus.   Eyes:  Negative for blurred vision and double vision.  Respiratory:  Negative for cough, hemoptysis, sputum production, shortness of breath and wheezing.   Cardiovascular:  Negative for chest pain, palpitations and leg swelling.  Gastrointestinal:  Negative for abdominal pain, constipation, diarrhea, heartburn, nausea and vomiting.  Genitourinary:  Negative for dysuria and urgency.  Musculoskeletal:  Positive for back pain. Negative for falls, joint pain, myalgias and neck pain.  Skin:  Negative for rash.  Neurological:  Positive for headaches (not as frequent). Negative for dizziness, tingling, tremors and weakness.  Endo/Heme/Allergies:  Does not bruise/bleed easily.  Psychiatric/Behavioral:  Negative for depression and suicidal ideas. The patient is not nervous/anxious and does not have insomnia.    Physical Exam:  BP 118/82    Pulse 89    Temp (!) 97.5 F (36.4 C)    Wt  197 lb 12.8 oz (89.7 kg)    SpO2 97%    BMI 28.38 kg/m   General Appearance: Well nourished, in no apparent distress. Eyes: PERRLA, EOMs, conjunctiva no swelling or erythema  Sinuses: No Frontal/maxillary tenderness ENT/Mouth: Ext aud canals clear, TMs without erythema, bulging. No erythema, swelling, or exudate on post pharynx.  Tonsils not swollen or erythematous. Hearing normal.  Neck: Supple, thyroid normal.  Respiratory: Respiratory effort normal, BS equal bilaterally without rales, rhonchi, wheezing or stridor.  Cardio:  RRR with no MRGs. Brisk peripheral pulses without edema.  Abdomen: Soft, + BS.  Non tender, no guarding, rebound, hernias, masses. Lymphatics: Non tender without lymphadenopathy.  Musculoskeletal: Full ROM, 5/5 strength, normal gait. Pain with bending at waist.  Skin: Warm, dry without rashes, lesions, ecchymosis.  Neuro: Cranial nerves intact. Normal muscle tone, no cerebellar symptoms. Sensation intact.  Psych: Awake and oriented X 3, normal affect, Insight and Judgment appropriate.    EKG: IRBBB, no ST changes  [facility]  09/24/2021

## 2021-09-24 ENCOUNTER — Encounter: Payer: Self-pay | Admitting: Nurse Practitioner

## 2021-09-24 ENCOUNTER — Ambulatory Visit (INDEPENDENT_AMBULATORY_CARE_PROVIDER_SITE_OTHER): Payer: BC Managed Care – PPO | Admitting: Nurse Practitioner

## 2021-09-24 ENCOUNTER — Other Ambulatory Visit: Payer: Self-pay

## 2021-09-24 VITALS — BP 118/82 | HR 89 | Temp 97.5°F | Wt 197.8 lb

## 2021-09-24 DIAGNOSIS — Z125 Encounter for screening for malignant neoplasm of prostate: Secondary | ICD-10-CM | POA: Diagnosis not present

## 2021-09-24 DIAGNOSIS — Z23 Encounter for immunization: Secondary | ICD-10-CM | POA: Diagnosis not present

## 2021-09-24 DIAGNOSIS — Z13220 Encounter for screening for lipoid disorders: Secondary | ICD-10-CM | POA: Diagnosis not present

## 2021-09-24 DIAGNOSIS — I10 Essential (primary) hypertension: Secondary | ICD-10-CM

## 2021-09-24 DIAGNOSIS — Z131 Encounter for screening for diabetes mellitus: Secondary | ICD-10-CM | POA: Diagnosis not present

## 2021-09-24 DIAGNOSIS — N401 Enlarged prostate with lower urinary tract symptoms: Secondary | ICD-10-CM | POA: Diagnosis not present

## 2021-09-24 DIAGNOSIS — Z79899 Other long term (current) drug therapy: Secondary | ICD-10-CM

## 2021-09-24 DIAGNOSIS — E559 Vitamin D deficiency, unspecified: Secondary | ICD-10-CM | POA: Diagnosis not present

## 2021-09-24 DIAGNOSIS — E291 Testicular hypofunction: Secondary | ICD-10-CM

## 2021-09-24 DIAGNOSIS — G43009 Migraine without aura, not intractable, without status migrainosus: Secondary | ICD-10-CM

## 2021-09-24 DIAGNOSIS — Z1389 Encounter for screening for other disorder: Secondary | ICD-10-CM | POA: Diagnosis not present

## 2021-09-24 DIAGNOSIS — R350 Frequency of micturition: Secondary | ICD-10-CM | POA: Diagnosis not present

## 2021-09-24 DIAGNOSIS — Z0000 Encounter for general adult medical examination without abnormal findings: Secondary | ICD-10-CM

## 2021-09-24 DIAGNOSIS — Z136 Encounter for screening for cardiovascular disorders: Secondary | ICD-10-CM

## 2021-09-24 DIAGNOSIS — Z0001 Encounter for general adult medical examination with abnormal findings: Secondary | ICD-10-CM

## 2021-09-24 DIAGNOSIS — Z1329 Encounter for screening for other suspected endocrine disorder: Secondary | ICD-10-CM

## 2021-09-24 DIAGNOSIS — E663 Overweight: Secondary | ICD-10-CM

## 2021-09-24 DIAGNOSIS — E782 Mixed hyperlipidemia: Secondary | ICD-10-CM

## 2021-09-24 NOTE — Progress Notes (Signed)
Vision: Rt eye: 20/25 Lt eye: 20/15 Both: 20/15

## 2021-09-24 NOTE — Patient Instructions (Signed)

## 2021-09-25 LAB — COMPLETE METABOLIC PANEL WITH GFR
AG Ratio: 1.5 (calc) (ref 1.0–2.5)
ALT: 17 U/L (ref 9–46)
AST: 18 U/L (ref 10–40)
Albumin: 4.3 g/dL (ref 3.6–5.1)
Alkaline phosphatase (APISO): 63 U/L (ref 36–130)
BUN: 24 mg/dL (ref 7–25)
CO2: 26 mmol/L (ref 20–32)
Calcium: 9.5 mg/dL (ref 8.6–10.3)
Chloride: 100 mmol/L (ref 98–110)
Creat: 1.01 mg/dL (ref 0.60–1.29)
Globulin: 2.8 g/dL (calc) (ref 1.9–3.7)
Glucose, Bld: 82 mg/dL (ref 65–99)
Potassium: 4.1 mmol/L (ref 3.5–5.3)
Sodium: 138 mmol/L (ref 135–146)
Total Bilirubin: 0.8 mg/dL (ref 0.2–1.2)
Total Protein: 7.1 g/dL (ref 6.1–8.1)
eGFR: 93 mL/min/{1.73_m2} (ref >=60)

## 2021-09-25 LAB — MICROALBUMIN / CREATININE URINE RATIO
Creatinine, Urine: 122 mg/dL (ref 20–320)
Microalb Creat Ratio: 14 mcg/mg creat (ref <30)
Microalb, Ur: 1.7 mg/dL

## 2021-09-25 LAB — CBC WITH DIFFERENTIAL/PLATELET
Absolute Monocytes: 713 cells/uL (ref 200–950)
Basophils Absolute: 16 cells/uL (ref 0–200)
Basophils Relative: 0.2 %
Eosinophils Absolute: 113 cells/uL (ref 15–500)
Eosinophils Relative: 1.4 %
HCT: 45.6 % (ref 38.5–50.0)
Hemoglobin: 15.9 g/dL (ref 13.2–17.1)
Lymphs Abs: 1466 cells/uL (ref 850–3900)
MCH: 32.8 pg (ref 27.0–33.0)
MCHC: 34.9 g/dL (ref 32.0–36.0)
MCV: 94 fL (ref 80.0–100.0)
MPV: 9.7 fL (ref 7.5–12.5)
Monocytes Relative: 8.8 %
Neutro Abs: 5792 cells/uL (ref 1500–7800)
Neutrophils Relative %: 71.5 %
Platelets: 235 10*3/uL (ref 140–400)
RBC: 4.85 10*6/uL (ref 4.20–5.80)
RDW: 11.9 % (ref 11.0–15.0)
Total Lymphocyte: 18.1 %
WBC: 8.1 10*3/uL (ref 3.8–10.8)

## 2021-09-25 LAB — URINALYSIS, ROUTINE W REFLEX MICROSCOPIC
Bilirubin Urine: NEGATIVE
Glucose, UA: NEGATIVE
Hgb urine dipstick: NEGATIVE
Ketones, ur: NEGATIVE
Leukocytes,Ua: NEGATIVE
Nitrite: NEGATIVE
Protein, ur: NEGATIVE
Specific Gravity, Urine: 1.021 (ref 1.001–1.035)
pH: 6.5 (ref 5.0–8.0)

## 2021-09-25 LAB — TSH: TSH: 1.11 mIU/L (ref 0.40–4.50)

## 2021-09-25 LAB — LIPID PANEL
Cholesterol: 247 mg/dL — ABNORMAL HIGH (ref <200)
HDL: 75 mg/dL (ref >=40)
LDL Cholesterol (Calc): 146 mg/dL (calc) — ABNORMAL HIGH
Non-HDL Cholesterol (Calc): 172 mg/dL (calc) — ABNORMAL HIGH (ref <130)
Total CHOL/HDL Ratio: 3.3 (calc) (ref <5.0)
Triglycerides: 136 mg/dL (ref <150)

## 2021-09-25 LAB — MAGNESIUM: Magnesium: 2.3 mg/dL (ref 1.5–2.5)

## 2021-09-25 LAB — PSA: PSA: 0.38 ng/mL (ref <=4.00)

## 2021-09-25 LAB — HEMOGLOBIN A1C
Hgb A1c MFr Bld: 5.5 % of total Hgb (ref <5.7)
Mean Plasma Glucose: 111 mg/dL
eAG (mmol/L): 6.2 mmol/L

## 2021-09-25 LAB — VITAMIN D 25 HYDROXY (VIT D DEFICIENCY, FRACTURES): Vit D, 25-Hydroxy: 34 ng/mL (ref 30–100)

## 2022-03-24 NOTE — Progress Notes (Signed)
FOLLOW UP  Assessment and Plan:   Dale Conley was seen today for migraine.  Diagnoses and all orders for this visit:  Essential hypertension -     CBC with Differential/Platelet -  Currently controlled without medication. DASH diet, exercise and monitor at home. Call if greater than 130/80.    Mixed hyperlipidemia Continue diet and exercise -     COMPLETE METABOLIC PANEL WITH GFR -     Lipid panel -     TSH   Lumbar back pain Follow up with orthopedics  Overweight (BMI 25.0-29.9) Fair life protein shakes Eat more frequently - try not to go more than 6 hours without protein Aim for 90 grams of protein a day- 30 breakfast/30 lunch 30 dinner Try to keep net carbs less than 50 Net Carbs=Total Carbs-fiber- sugar alcohols Exercise heartrate 120-140(fat burning zone)- walking 20-30 minutes 4 days a week    Migraine without aura and without status migrainosus, not intractable Fairly controlled without medication   Vitamin D Deficiency Continue Vit D supplementation to maintain value in therapeutic level of 60-100  - VIT D  Medication Management Continued     Continue diet and meds as discussed. Further disposition pending results of labs. Discussed med's effects and SE's.   Over 30 minutes of exam, counseling, chart review, and critical decision making was performed.   Future Appointments  Date Time Provider Department Center  09/24/2022  2:00 PM [name], NP [facility] None    ----------------------------------------------------------------------------------------------------------------------  HPI 46 y.o. male  presents for 3 month follow up on hypertension, cholesterol,  weight and vitamin D deficiency. Also having migraines  Headaches have improved.  Not having to use Triptans.   He has started to have return of lower back pain.  Three years ago he had nerve ablation in 2019. Pain had been resolved with that procedure but has returned. He usually sees Dr.  Modesto Charon.    He had left sided L5-S1 microdiscectomy in 11/2020.  BMI is Body mass index is 29.3 kg/m., he has been working on diet and exercise. Having a lot of difficulty losing weight. He is working out with a Systems analyst twice a week  He walks 2 miles 1-2 times a week.  He is eating less saturated fat and simple carbs. He is not skipping meals Wt Readings from Last 3 Encounters:  03/25/22 204 lb 3.2 oz (92.6 kg)  09/24/21 197 lb 12.8 oz (89.7 kg)  09/16/21 199 lb (90.3 kg)    His blood pressure has been controlled at home, today their BP is BP: 128/82 BP Readings from Last 3 Encounters:  03/25/22 128/82  09/24/21 118/82  09/16/21 120/80     He does workout. He denies chest pain, shortness of breath, dizziness.   He is not on cholesterol medication   His cholesterol is not at goal. The cholesterol last visit was:   Lab Results  Component Value Date   CHOL 247 (H) 09/24/2021   HDL 75 09/24/2021   LDLCALC 146 (H) 09/24/2021   TRIG 136 09/24/2021   CHOLHDL 3.3 09/24/2021     Patient is on Vitamin D supplement.   Lab Results  Component Value Date   VD25OH 34 09/24/2021        Current Medications:  Current Outpatient Medications on File Prior to Visit  Medication Sig   IBUPROFEN PO Take by mouth.   meloxicam (MOBIC) 15 MG tablet Take 1/2 to 1 tablet Daily with Food for Pain & Inflammation (Patient taking  differently: as needed. Take 1/2 to 1 tablet Daily with Food for Pain & Inflammation)   methocarbamol (ROBAXIN) 500 MG tablet TAKE 1-2 TABLETS BY MOUTH Q6HRS AS NEEDED FOR MUSCLE SPASMS (Patient taking differently: as needed. TAKE 1-2 TABLETS BY MOUTH Q6HRS AS NEEDED FOR MUSCLE SPASMS)   Multiple Vitamin (MULTIVITAMIN) tablet Take 1 tablet by mouth daily.   VITAMIN D PO Take by mouth.   tiZANidine (ZANAFLEX) 4 MG tablet Take 4 mg by mouth every 6 (six) hours as needed.   No current facility-administered medications on file prior to visit.     Allergies:  Allergies   Allergen Reactions   Flexeril [Cyclobenzaprine] Other (See Comments)    Makes pt groggy     Medical History:  Past Medical History:  Diagnosis Date   Sleep apnea    Family history- Reviewed and unchanged Social history- Reviewed and unchanged   Review of Systems:  Review of Systems  Constitutional:  Negative for chills and fever.  HENT:  Negative for congestion, hearing loss, sinus pain and sore throat.   Eyes:  Negative for blurred vision.  Respiratory:  Negative for cough and hemoptysis.   Cardiovascular:  Negative for chest pain, palpitations and leg swelling.  Gastrointestinal:  Negative for abdominal pain, diarrhea, heartburn, nausea and vomiting.  Genitourinary:  Negative for dysuria.  Musculoskeletal:  Back pain: lumbar pain.  Neurological:  Negative for dizziness and headaches.  Psychiatric/Behavioral:  Negative for depression. The patient does not have insomnia.       Physical Exam: BP 128/82   Pulse 79   Temp (!) 97.3 F (36.3 C)   Wt 204 lb 3.2 oz (92.6 kg)   SpO2 94%   BMI 29.30 kg/m  Wt Readings from Last 3 Encounters:  03/25/22 204 lb 3.2 oz (92.6 kg)  09/24/21 197 lb 12.8 oz (89.7 kg)  09/16/21 199 lb (90.3 kg)   General Appearance: Well nourished, in no apparent distress. Eyes: PERRLA, EOMs, conjunctiva no swelling or erythema Sinuses: No Frontal/maxillary tenderness ENT/Mouth: Ext aud canals clear, TMs without erythema, bulging. No erythema, swelling, or exudate on post pharynx.  Tonsils not swollen or erythematous. Hearing normal.  Neck: Supple, thyroid normal.  Respiratory: Respiratory effort normal, BS equal bilaterally without rales, rhonchi, wheezing or stridor.  Cardio: RRR with no MRGs. Brisk peripheral pulses without edema.  Abdomen: Soft, + BS.  Non tender, no guarding, rebound, hernias, masses. Lymphatics: Non tender without lymphadenopathy.  Musculoskeletal: Full ROM, 5/5 strength, Normal gait Skin: Warm, dry without rashes, lesions,  ecchymosis.  Neuro: Cranial nerves intact. No cerebellar symptoms.  Psych: Awake and oriented X 3, normal affect, Insight and Judgment appropriate.    [name], NP 3:48 PM [facility]

## 2022-03-25 ENCOUNTER — Encounter: Payer: Self-pay | Admitting: Nurse Practitioner

## 2022-03-25 ENCOUNTER — Ambulatory Visit (INDEPENDENT_AMBULATORY_CARE_PROVIDER_SITE_OTHER): Payer: BC Managed Care – PPO | Admitting: Nurse Practitioner

## 2022-03-25 VITALS — BP 128/82 | HR 79 | Temp 97.3°F | Wt 204.2 lb

## 2022-03-25 DIAGNOSIS — E559 Vitamin D deficiency, unspecified: Secondary | ICD-10-CM | POA: Diagnosis not present

## 2022-03-25 DIAGNOSIS — I10 Essential (primary) hypertension: Secondary | ICD-10-CM

## 2022-03-25 DIAGNOSIS — E663 Overweight: Secondary | ICD-10-CM

## 2022-03-25 DIAGNOSIS — G43009 Migraine without aura, not intractable, without status migrainosus: Secondary | ICD-10-CM

## 2022-03-25 DIAGNOSIS — Z79899 Other long term (current) drug therapy: Secondary | ICD-10-CM

## 2022-03-25 DIAGNOSIS — E782 Mixed hyperlipidemia: Secondary | ICD-10-CM | POA: Diagnosis not present

## 2022-03-25 LAB — COMPLETE METABOLIC PANEL WITH GFR
AG Ratio: 1.7 (calc) (ref 1.0–2.5)
ALT: 73 U/L — ABNORMAL HIGH (ref 9–46)
AST: 106 U/L — ABNORMAL HIGH (ref 10–40)
Albumin: 4.6 g/dL (ref 3.6–5.1)
Alkaline phosphatase (APISO): 72 U/L (ref 36–130)
BUN: 21 mg/dL (ref 7–25)
CO2: 26 mmol/L (ref 20–32)
Calcium: 10.3 mg/dL (ref 8.6–10.3)
Chloride: 103 mmol/L (ref 98–110)
Creat: 1.17 mg/dL (ref 0.60–1.29)
Globulin: 2.7 g/dL (calc) (ref 1.9–3.7)
Glucose, Bld: 86 mg/dL (ref 65–99)
Potassium: 4.2 mmol/L (ref 3.5–5.3)
Sodium: 138 mmol/L (ref 135–146)
Total Bilirubin: 0.5 mg/dL (ref 0.2–1.2)
Total Protein: 7.3 g/dL (ref 6.1–8.1)
eGFR: 78 mL/min/{1.73_m2} (ref >=60)

## 2022-03-25 LAB — CBC WITH DIFFERENTIAL/PLATELET
Absolute Monocytes: 612 cells/uL (ref 200–950)
Basophils Absolute: 41 cells/uL (ref 0–200)
Basophils Relative: 0.6 %
Eosinophils Absolute: 170 cells/uL (ref 15–500)
Eosinophils Relative: 2.5 %
HCT: 45.3 % (ref 38.5–50.0)
Hemoglobin: 15.7 g/dL (ref 13.2–17.1)
Lymphs Abs: 2468 cells/uL (ref 850–3900)
MCH: 31.7 pg (ref 27.0–33.0)
MCHC: 34.7 g/dL (ref 32.0–36.0)
MCV: 91.3 fL (ref 80.0–100.0)
MPV: 9.4 fL (ref 7.5–12.5)
Monocytes Relative: 9 %
Neutro Abs: 3509 cells/uL (ref 1500–7800)
Neutrophils Relative %: 51.6 %
Platelets: 243 10*3/uL (ref 140–400)
RBC: 4.96 10*6/uL (ref 4.20–5.80)
RDW: 12.2 % (ref 11.0–15.0)
Total Lymphocyte: 36.3 %
WBC: 6.8 10*3/uL (ref 3.8–10.8)

## 2022-03-25 LAB — TSH: TSH: 1.76 mIU/L (ref 0.40–4.50)

## 2022-03-25 LAB — LIPID PANEL
Cholesterol: 296 mg/dL — ABNORMAL HIGH (ref <200)
HDL: 70 mg/dL (ref >=40)
LDL Cholesterol (Calc): 184 mg/dL (calc) — ABNORMAL HIGH
Non-HDL Cholesterol (Calc): 226 mg/dL (calc) — ABNORMAL HIGH (ref <130)
Total CHOL/HDL Ratio: 4.2 (calc) (ref <5.0)
Triglycerides: 226 mg/dL — ABNORMAL HIGH (ref <150)

## 2022-03-25 LAB — VITAMIN D 25 HYDROXY (VIT D DEFICIENCY, FRACTURES): Vit D, 25-Hydroxy: 73 ng/mL (ref 30–100)

## 2022-03-26 ENCOUNTER — Other Ambulatory Visit: Payer: Self-pay | Admitting: Nurse Practitioner

## 2022-03-26 DIAGNOSIS — R748 Abnormal levels of other serum enzymes: Secondary | ICD-10-CM

## 2022-03-30 ENCOUNTER — Other Ambulatory Visit: Payer: BC Managed Care – PPO

## 2022-04-01 ENCOUNTER — Encounter: Payer: Self-pay | Admitting: Nurse Practitioner

## 2022-04-01 ENCOUNTER — Ambulatory Visit
Admission: RE | Admit: 2022-04-01 | Discharge: 2022-04-01 | Disposition: A | Discharge status: Home / Self Care | Payer: BC Managed Care – PPO | Source: Ambulatory Visit | Attending: Nurse Practitioner | Admitting: Nurse Practitioner

## 2022-04-01 ENCOUNTER — Other Ambulatory Visit: Payer: Self-pay | Admitting: Nurse Practitioner

## 2022-04-01 DIAGNOSIS — R7989 Other specified abnormal findings of blood chemistry: Secondary | ICD-10-CM | POA: Diagnosis not present

## 2022-04-01 DIAGNOSIS — I714 Abdominal aortic aneurysm, without rupture, unspecified: Secondary | ICD-10-CM | POA: Insufficient documentation

## 2022-04-01 DIAGNOSIS — K8010 Calculus of gallbladder with chronic cholecystitis without obstruction: Secondary | ICD-10-CM

## 2022-04-01 DIAGNOSIS — R748 Abnormal levels of other serum enzymes: Secondary | ICD-10-CM

## 2022-04-02 ENCOUNTER — Other Ambulatory Visit: Payer: Self-pay | Admitting: Nurse Practitioner

## 2022-04-02 MED ORDER — DIAZEPAM 5 MG PO TABS: ORAL_TABLET | ORAL | 0 refills | Status: DC

## 2022-04-02 NOTE — Progress Notes (Signed)
Valium sent in to pharmacy to take 1 hour prior to MRI due to fear of confined spaces

## 2022-04-05 ENCOUNTER — Ambulatory Visit
Admission: RE | Admit: 2022-04-05 | Discharge: 2022-04-05 | Disposition: A | Discharge status: Home / Self Care | Payer: BC Managed Care – PPO | Source: Ambulatory Visit | Attending: Nurse Practitioner | Admitting: Nurse Practitioner

## 2022-04-05 DIAGNOSIS — R935 Abnormal findings on diagnostic imaging of other abdominal regions, including retroperitoneum: Secondary | ICD-10-CM | POA: Diagnosis not present

## 2022-04-05 DIAGNOSIS — K760 Fatty (change of) liver, not elsewhere classified: Secondary | ICD-10-CM | POA: Diagnosis not present

## 2022-04-05 DIAGNOSIS — K8010 Calculus of gallbladder with chronic cholecystitis without obstruction: Secondary | ICD-10-CM

## 2022-04-05 DIAGNOSIS — R7989 Other specified abnormal findings of blood chemistry: Secondary | ICD-10-CM | POA: Diagnosis not present

## 2022-04-05 DIAGNOSIS — R748 Abnormal levels of other serum enzymes: Secondary | ICD-10-CM

## 2022-04-05 MED ORDER — GADOBENATE DIMEGLUMINE 529 MG/ML IV SOLN
20.0000 mL | Freq: Once | INTRAVENOUS | Status: AC | PRN
Start: 2022-04-05 — End: 2022-04-05
  Administered 2022-04-05: 20 mL via INTRAVENOUS

## 2022-04-12 ENCOUNTER — Other Ambulatory Visit: Payer: BC Managed Care – PPO

## 2022-04-27 DIAGNOSIS — M47816 Spondylosis without myelopathy or radiculopathy, lumbar region: Secondary | ICD-10-CM | POA: Diagnosis not present

## 2022-05-08 ENCOUNTER — Other Ambulatory Visit: Payer: Self-pay | Admitting: Nurse Practitioner

## 2022-05-08 DIAGNOSIS — E559 Vitamin D deficiency, unspecified: Secondary | ICD-10-CM

## 2022-05-08 DIAGNOSIS — G43009 Migraine without aura, not intractable, without status migrainosus: Secondary | ICD-10-CM

## 2022-05-28 ENCOUNTER — Other Ambulatory Visit: Payer: Self-pay | Admitting: Nurse Practitioner

## 2022-05-28 DIAGNOSIS — E559 Vitamin D deficiency, unspecified: Secondary | ICD-10-CM

## 2022-07-02 DIAGNOSIS — M47817 Spondylosis without myelopathy or radiculopathy, lumbosacral region: Secondary | ICD-10-CM | POA: Diagnosis not present

## 2022-07-02 DIAGNOSIS — M47816 Spondylosis without myelopathy or radiculopathy, lumbar region: Secondary | ICD-10-CM | POA: Diagnosis not present

## 2022-08-06 IMAGING — CR DG LUMBAR SPINE 2-3V
2 series · 2 of 2 positions shown · non-contrast
Comparison: MRI lumbar spine 11/29/2020.

CLINICAL DATA: Lumbar spine surgery.

EXAM:
LUMBAR SPINE - 2-3 VIEW

[lateral (1 of 2)]
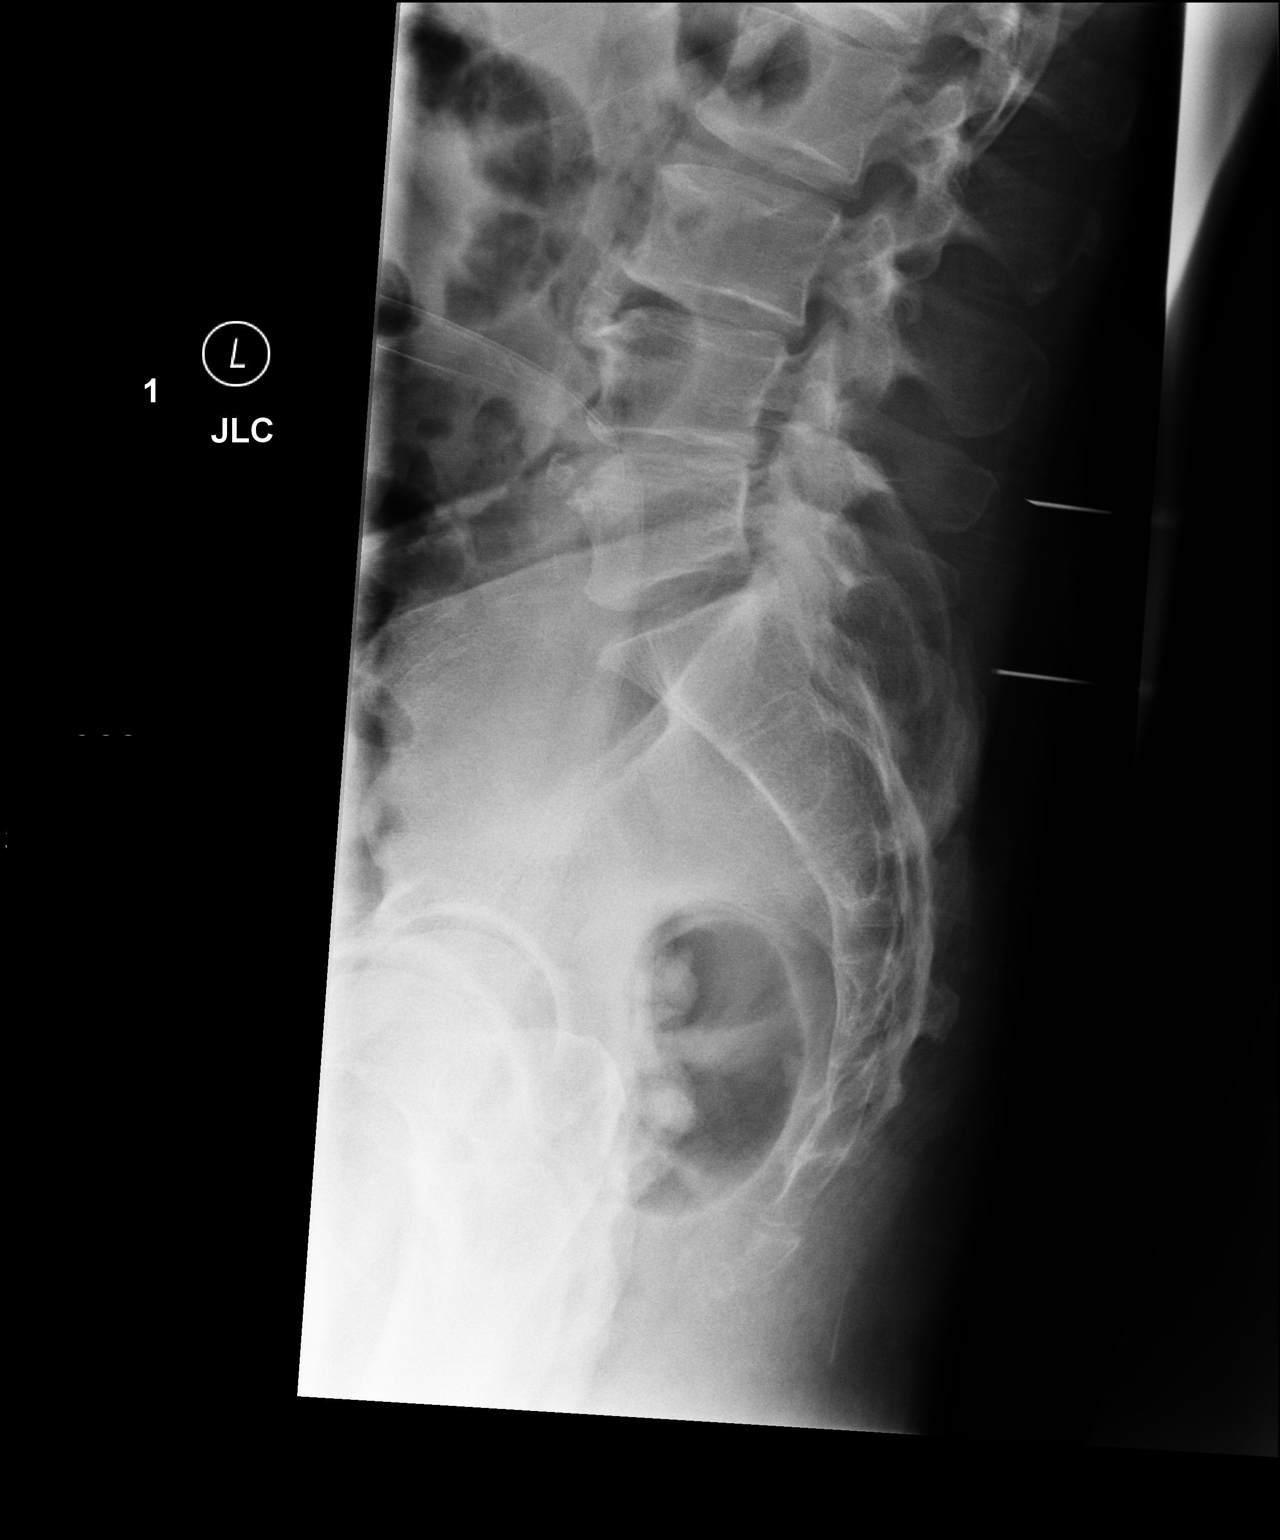

[lateral (2 of 2)]
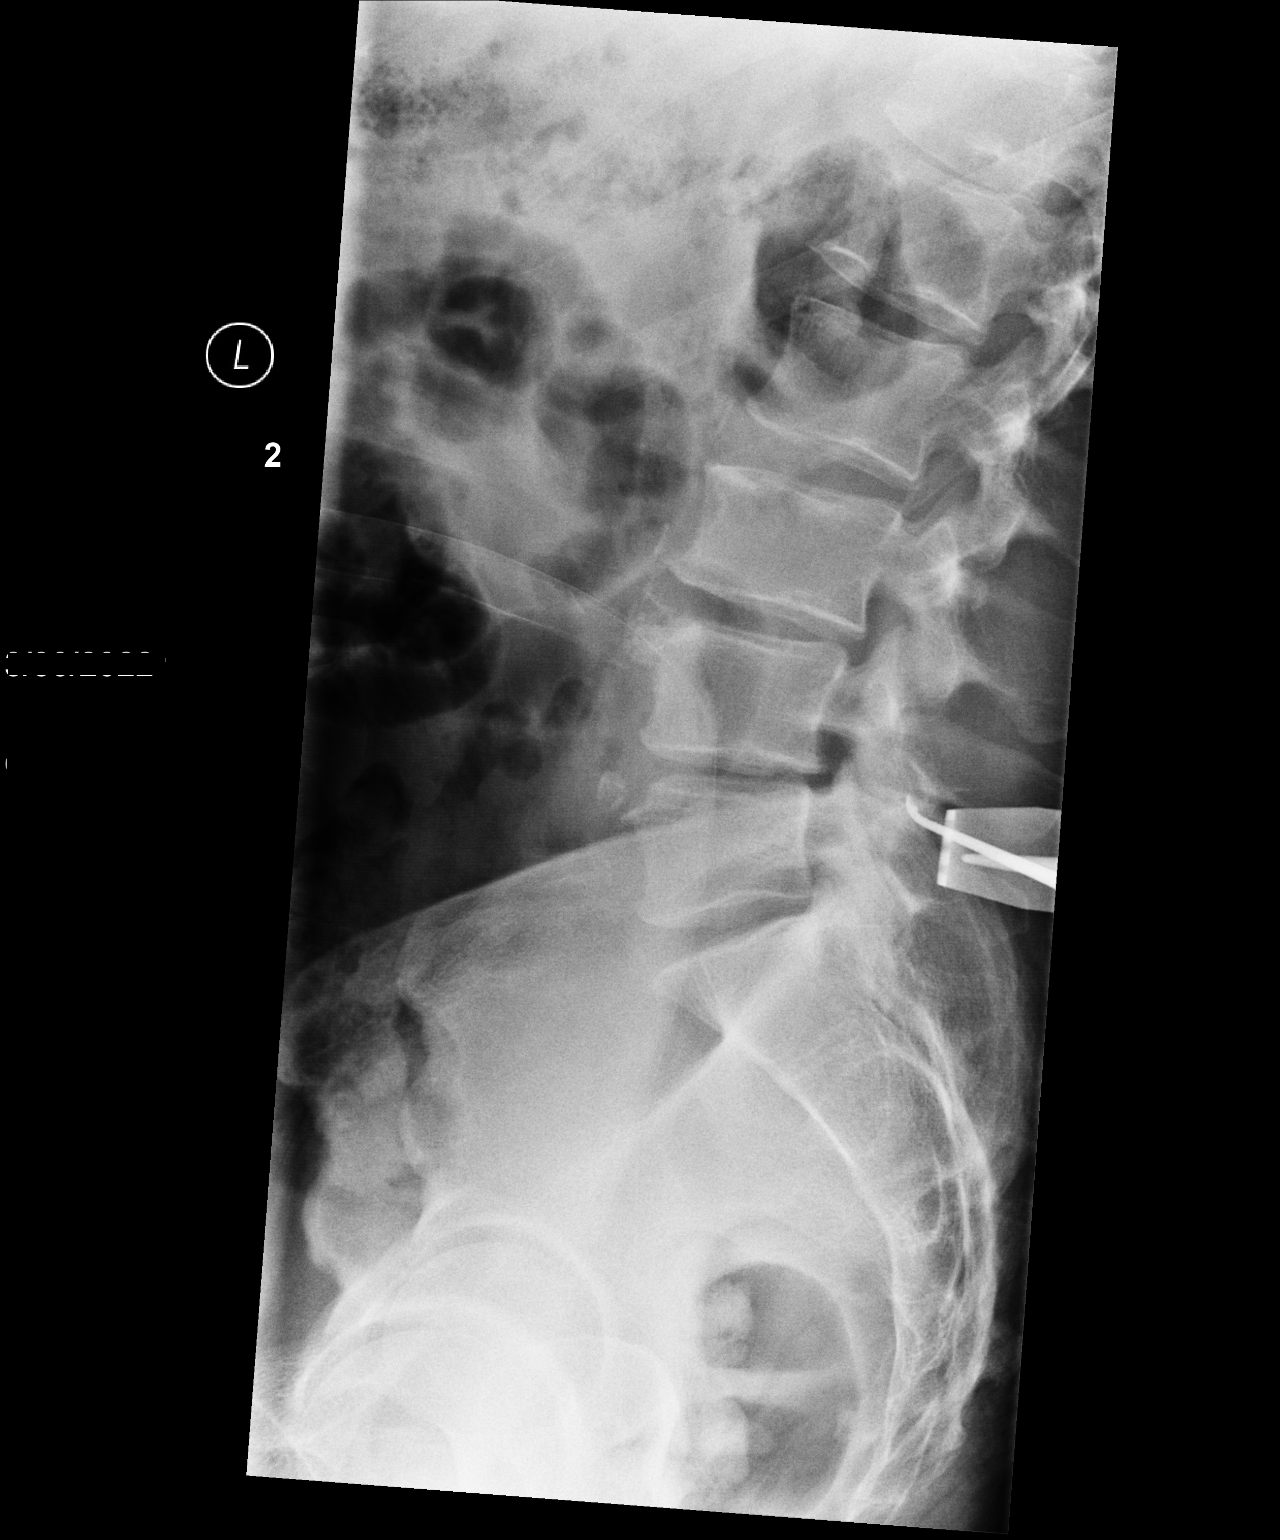

[2 of 2 positions shown; findings below may reference images not displayed]

FINDINGS: Lumbar spine numbered as per prior MRI. On image number 2 metallic
marker noted posteriorly at the level of L4-L5. Degenerative changes
lumbar spine. No acute bony abnormality. Aortic atherosclerotic
vascular calcification.
IMPRESSION: On image number 2 metallic marker noted posteriorly at the L4-L5
level.

## 2022-08-17 ENCOUNTER — Encounter: Payer: Self-pay | Admitting: Nurse Practitioner

## 2022-08-17 ENCOUNTER — Other Ambulatory Visit: Payer: Self-pay

## 2022-08-17 ENCOUNTER — Ambulatory Visit (INDEPENDENT_AMBULATORY_CARE_PROVIDER_SITE_OTHER): Payer: BC Managed Care – PPO | Admitting: Nurse Practitioner

## 2022-08-17 VITALS — BP 138/80 | HR 81 | Temp 96.5°F | Ht 70.0 in | Wt 187.2 lb

## 2022-08-17 DIAGNOSIS — Z1152 Encounter for screening for COVID-19: Secondary | ICD-10-CM

## 2022-08-17 DIAGNOSIS — M545 Low back pain, unspecified: Secondary | ICD-10-CM | POA: Diagnosis not present

## 2022-08-17 DIAGNOSIS — I10 Essential (primary) hypertension: Secondary | ICD-10-CM

## 2022-08-17 LAB — POC COVID19 BINAXNOW: SARS Coronavirus 2 Ag: NEGATIVE

## 2022-08-17 MED ORDER — TIZANIDINE HCL 4 MG PO TABS: 4.0000 mg | ORAL_TABLET | Freq: Four times a day (QID) | ORAL | 2 refills | Status: DC | PRN

## 2022-08-17 NOTE — Progress Notes (Signed)
Assessment and Plan:  Dale Conley was seen today for acute visit.  Diagnoses and all orders for this visit:  Encounter for screening for COVID-19 -     POC COVID-19- negative -  Wear mask around other people for the next 5 days -  Continue Vit C, Vit D and Zinc  Push fluids  Essential hypertension - controlled without meds. Continue DASH diet, exercise and monitor at home. Call if greater than 130/80.   Acute left-sided low back pain without sciatica Continue exercise, diet and weight loss -     tiZANidine (ZANAFLEX) 4 MG tablet; Take 1 tablet (4 mg total) by mouth every 6 (six) hours as needed.       Further disposition pending results of labs. Discussed med's effects and SE's.   Over 30 minutes of exam, counseling, chart review, and critical decision making was performed.   Future Appointments  Date Time Provider Department Center  09/24/2022  2:00 PM [name], NP [facility] None    ------------------------------------------------------------------------------------------------------------------   HPI Pulse 81   Temp (!) 96.5 F (35.8 C)   SpO2 96%   46 y.o.male presents for note to return to work.  He tested positive for Covid 5 days ago.  He is feeling well at this time. Covid test today in the office is negative.   BP is well controlled without medication  Denies headaches, chest pain, shortness of breath and dizziness.  BP Readings from Last 3 Encounters:  03/25/22 128/82  09/24/21 118/82  09/16/21 120/80   BMI is Body mass index is 26.86 kg/m., he has been working on diet and exercise. He has lost 17 pounds in the past 5 months Wt Readings from Last 3 Encounters:  08/17/22 187 lb 3.2 oz (84.9 kg)  03/25/22 204 lb 3.2 oz (92.6 kg)  09/24/21 197 lb 12.8 oz (89.7 kg)   He continues to use tizanidine for low back pain on left side.  It does provide enough relief for him to be able to sleep at night    Past Medical History:  Diagnosis Date   Sleep  apnea      Allergies  Allergen Reactions   Flexeril [Cyclobenzaprine] Other (See Comments)    Makes pt groggy    Current Outpatient Medications on File Prior to Visit  Medication Sig   IBUPROFEN PO Take by mouth.   meloxicam (MOBIC) 15 MG tablet Take 1/2 to 1 tablet Daily with Food for Pain & Inflammation (Patient taking differently: as needed. Take 1/2 to 1 tablet Daily with Food for Pain & Inflammation)   methocarbamol (ROBAXIN) 500 MG tablet TAKE 1-2 TABLETS BY MOUTH Q6HRS AS NEEDED FOR MUSCLE SPASMS (Patient taking differently: as needed. TAKE 1-2 TABLETS BY MOUTH Q6HRS AS NEEDED FOR MUSCLE SPASMS)   Multiple Vitamin (MULTIVITAMIN) tablet Take 1 tablet by mouth daily.   tiZANidine (ZANAFLEX) 4 MG tablet Take 4 mg by mouth every 6 (six) hours as needed.   VITAMIN D PO Take by mouth.   diazepam (VALIUM) 5 MG tablet Take 1 tab 1 hour prior to MRI.  Do not drive on this medication (Patient not taking: Reported on 08/17/2022)   No current facility-administered medications on file prior to visit.    ROS: all negative except above.   Physical Exam:  Pulse 81   Temp (!) 96.5 F (35.8 C)   SpO2 96%   General Appearance: Well nourished, in no apparent distress. Eyes: PERRLA, EOMs, conjunctiva no swelling or erythema Sinuses: No Frontal/maxillary  tenderness ENT/Mouth: Ext aud canals clear, TMs without erythema, bulging. No erythema, swelling, or exudate on post pharynx.  Tonsils not swollen or erythematous. Hearing normal.  Neck: Supple, thyroid normal.  Respiratory: Respiratory effort normal, BS equal bilaterally without rales, rhonchi, wheezing or stridor.  Cardio: RRR with no MRGs. Brisk peripheral pulses without edema.  Abdomen: Soft, + BS.  Non tender, no guarding, rebound, hernias, masses. Lymphatics: Non tender without lymphadenopathy.  Musculoskeletal: Full ROM, 5/5 strength, normal gait.  Skin: Warm, dry without rashes, lesions, ecchymosis.  Neuro: Cranial nerves intact.  Normal muscle tone, no cerebellar symptoms. Sensation intact.  Psych: Awake and oriented X 3, normal affect, Insight and Judgment appropriate.     [name], NP 2:02 PM [facility]

## 2022-09-17 DIAGNOSIS — R41840 Attention and concentration deficit: Secondary | ICD-10-CM | POA: Diagnosis not present

## 2022-09-23 NOTE — Progress Notes (Unsigned)
Complete Physical  Assessment and Plan:  Dale Conley was seen today for annual exam. Diagnoses and all orders for this visit:  Encounter for general Adult medical Examination with Abnormal Findings Due Annually  Essential hypertension No medications Monitor blood pressure at home; call if consistently over 130/80 Continue DASH diet.   Reminder to go to the ER if any CP, SOB, nausea, dizziness, severe HA, changes vision/speech, left arm numbness and tingling and jaw pain.  Mixed hyperlipidemia No medications Discussed dietary and exercise modifications Low fat diet  Migraine Have improved with decreasing frequency of use of Methocarbomol and Mobic Monitor  Abnormal glucose Continue diet and exercise - A1c  Hypogonadism in male No supplementation at this time Exercising more  Overweight (BMI 25.0-29.9) Discussed dietary and exercise modifications  Vitamin D deficiency Continue supplementation Will monitor next routine visit  Medication management Continued  Screening for prostate cancer - PSA  Screening for diabetes mellitus -A1c  Screening, ischemic heart disease -EKG  Screening for blood or protein in urine -Routine urine with Reflex microscopic -Microalbumin/creatinine urine ratio  Screening for thyroid disorder -TSH  Flu Vaccine Need Flu Vaccine Quad 6+ MOS PF IM given  Patient agrees with plan of care.  Discussed when to call or return and hospital precautions. Further disposition pending results of labs. Discussed med's effects and SE's.   Over 30 minutes of face to face interview, exam, counseling, chart review, and critical decision making was performed.   Future Appointments  Date Time Provider Department Center  09/24/2022  2:00 PM [name], NP [facility] None  09/28/2023  2:00 PM [name], NP [facility] None     ------------------------------------------------------------------------------------------------------------------   HPI 46 y.o.male presents for complete physical. has Back pain; Hypogonadism in male; Essential hypertension; Vitamin D deficiency; Medication management; Obstructive sleep apnea syndrome; Headache; Overweight (BMI 25.0-29.9); Hyperlipidemia; Elevated LFTs; and AAA (abdominal aortic aneurysm) without rupture (HCC) on their problem list.   Reports he had a back injury. Currently managing pain with Meloxicam and Robaxin as needed.  The Meloxicam and Methocarbomol were triggering migraines.  Headaches are improved with decreasing use of those meds.   He had a sleep study and recommend CPAP from [facility]. He is on the nose piece, can tolerate for about 4 hours a night. Has not been using every night.   BMI is There is no height or weight on file to calculate BMI., he has been working on diet and exercise. Weights and cardio.  Wt Readings from Last 3 Encounters:  08/17/22 187 lb 3.2 oz (84.9 kg)  03/25/22 204 lb 3.2 oz (92.6 kg)  09/24/21 197 lb 12.8 oz (89.7 kg)     His blood pressure has been controlled at home, today their BP is   BP Readings from Last 3 Encounters:  08/17/22 138/80  03/25/22 128/82  09/24/21 118/82     He does workout. He denies chest pain, shortness of breath, dizziness.  He is not on cholesterol medication and denies myalgias. His cholesterol is not at goal. The cholesterol last visit was:   Lab Results  Component Value Date   CHOL 296 (H) 03/25/2022   HDL 70 03/25/2022   LDLCALC 184 (H) 03/25/2022   TRIG 226 (H) 03/25/2022   CHOLHDL 4.2 03/25/2022    He has been working on diet and exercise for prediabetes, and denies polydipsia, polyuria, visual disturbances, vomiting and weight loss. Last A1C in the office was:  Lab Results  Component Value Date   HGBA1C 5.5  09/24/2021   Patient is on Vitamin D supplement.   Lab Results  Component Value  Date   VD25OH 23 03/25/2022   He has a history of testosterone deficiency, it not on anything, states it makes him irritable.  Lab Results  Component Value Date   TESTOSTERONE 238 (L) 09/22/2018     Names of Other Physician/Practitioners you currently use: 1. [facility] here for primary care 2.  Eye Exam: Has not had 3. Dentist: Declines relate dot COVID19  Patient Care Team: Dale Cowboy, MD as PCP - General (Internal Medicine)    Screening Tests: Immunization History  Administered Date(s) Administered   Influenza Inj Mdck Quad With Preservative 07/19/2019, 09/24/2020   Influenza,inj,Quad PF,6+ Mos 09/24/2021   PFIZER(Purple Top)SARS-COV-2 Vaccination 12/22/2019, 01/12/2020, 07/29/2020    Preventative care: Last colonoscopy: N/A   Vaccinations: TD or Tdap: 2015 Influenza: 08/2020 received today Pneumococcal: N/A Prevnar13: N/A  Shingles/Zostavax: N/A  [place]ARs-COV2[place] 07/2020    Past Medical History:  Diagnosis Date   Sleep apnea      Allergies  Allergen Reactions   Flexeril [Cyclobenzaprine] Other (See Comments)    Makes pt groggy    Current Outpatient Medications on File Prior to Visit  Medication Sig   IBUPROFEN PO Take by mouth.   meloxicam (MOBIC) 15 MG tablet Take 1/2 to 1 tablet Daily with Food for Pain & Inflammation (Patient taking differently: as needed. Take 1/2 to 1 tablet Daily with Food for Pain & Inflammation)   Multiple Vitamin (MULTIVITAMIN) tablet Take 1 tablet by mouth daily.   tiZANidine (ZANAFLEX) 4 MG tablet Take 1 tablet (4 mg total) by mouth every 6 (six) hours as needed.   VITAMIN D PO Take by mouth.   No current facility-administered medications on file prior to visit.    ROS: Review of Systems  Constitutional:  Negative for chills and fever.  HENT:  Negative for congestion, hearing loss, sinus pain, sore throat and tinnitus.   Eyes:  Negative for blurred vision and  double vision.  Respiratory:  Negative for cough, hemoptysis, sputum production, shortness of breath and wheezing.   Cardiovascular:  Negative for chest pain, palpitations and leg swelling.  Gastrointestinal:  Negative for abdominal pain, constipation, diarrhea, heartburn, nausea and vomiting.  Genitourinary:  Negative for dysuria and urgency.  Musculoskeletal:  Positive for back pain. Negative for falls, joint pain, myalgias and neck pain.  Skin:  Negative for rash.  Neurological:  Positive for headaches (not as frequent). Negative for dizziness, tingling, tremors and weakness.  Endo/Heme/Allergies:  Does not bruise/bleed easily.  Psychiatric/Behavioral:  Negative for depression and suicidal ideas. The patient is not nervous/anxious and does not have insomnia.     Physical Exam:  There were no vitals taken for this visit.  General Appearance: Well nourished, in no apparent distress. Eyes: PERRLA, EOMs, conjunctiva no swelling or erythema  Sinuses: No Frontal/maxillary tenderness ENT/Mouth: Ext aud canals clear, TMs without erythema, bulging. No erythema, swelling, or exudate on post pharynx.  Tonsils not swollen or erythematous. Hearing normal.  Neck: Supple, thyroid normal.  Respiratory: Respiratory effort normal, BS equal bilaterally without rales, rhonchi, wheezing or stridor.  Cardio: RRR with no MRGs. Brisk peripheral pulses without edema.  Abdomen: Soft, + BS.  Non tender, no guarding, rebound, hernias, masses. Lymphatics: Non tender without lymphadenopathy.  Musculoskeletal: Full ROM, 5/5 strength, normal gait. Pain with bending at waist.  Skin: Warm, dry without rashes, lesions, ecchymosis.  Neuro: Cranial nerves intact.  Normal muscle tone, no cerebellar symptoms. Sensation intact.  Psych: Awake and oriented X 3, normal affect, Insight and Judgment appropriate.    EKG: IRBBB, no ST changes  [facility]  P.A.  09/23/2022

## 2022-09-24 ENCOUNTER — Encounter: Payer: Self-pay | Admitting: Nurse Practitioner

## 2022-09-24 ENCOUNTER — Ambulatory Visit (INDEPENDENT_AMBULATORY_CARE_PROVIDER_SITE_OTHER): Payer: BC Managed Care – PPO | Admitting: Nurse Practitioner

## 2022-09-24 VITALS — BP 130/84 | HR 82 | Temp 97.9°F | Resp 17 | Ht 70.0 in | Wt 184.6 lb

## 2022-09-24 DIAGNOSIS — E291 Testicular hypofunction: Secondary | ICD-10-CM

## 2022-09-24 DIAGNOSIS — Z0000 Encounter for general adult medical examination without abnormal findings: Secondary | ICD-10-CM

## 2022-09-24 DIAGNOSIS — Z131 Encounter for screening for diabetes mellitus: Secondary | ICD-10-CM

## 2022-09-24 DIAGNOSIS — Z13220 Encounter for screening for lipoid disorders: Secondary | ICD-10-CM | POA: Diagnosis not present

## 2022-09-24 DIAGNOSIS — Z1329 Encounter for screening for other suspected endocrine disorder: Secondary | ICD-10-CM

## 2022-09-24 DIAGNOSIS — N401 Enlarged prostate with lower urinary tract symptoms: Secondary | ICD-10-CM

## 2022-09-24 DIAGNOSIS — R7309 Other abnormal glucose: Secondary | ICD-10-CM

## 2022-09-24 DIAGNOSIS — Z136 Encounter for screening for cardiovascular disorders: Secondary | ICD-10-CM

## 2022-09-24 DIAGNOSIS — E559 Vitamin D deficiency, unspecified: Secondary | ICD-10-CM | POA: Diagnosis not present

## 2022-09-24 DIAGNOSIS — R350 Frequency of micturition: Secondary | ICD-10-CM

## 2022-09-24 DIAGNOSIS — I10 Essential (primary) hypertension: Secondary | ICD-10-CM | POA: Diagnosis not present

## 2022-09-24 DIAGNOSIS — I714 Abdominal aortic aneurysm, without rupture, unspecified: Secondary | ICD-10-CM

## 2022-09-24 DIAGNOSIS — Z79899 Other long term (current) drug therapy: Secondary | ICD-10-CM

## 2022-09-24 DIAGNOSIS — E663 Overweight: Secondary | ICD-10-CM

## 2022-09-24 DIAGNOSIS — Z125 Encounter for screening for malignant neoplasm of prostate: Secondary | ICD-10-CM

## 2022-09-24 DIAGNOSIS — E782 Mixed hyperlipidemia: Secondary | ICD-10-CM

## 2022-09-24 DIAGNOSIS — Z23 Encounter for immunization: Secondary | ICD-10-CM | POA: Diagnosis not present

## 2022-09-24 DIAGNOSIS — G4733 Obstructive sleep apnea (adult) (pediatric): Secondary | ICD-10-CM

## 2022-09-24 DIAGNOSIS — Z1389 Encounter for screening for other disorder: Secondary | ICD-10-CM | POA: Diagnosis not present

## 2022-09-24 DIAGNOSIS — Z0001 Encounter for general adult medical examination with abnormal findings: Secondary | ICD-10-CM

## 2022-09-24 DIAGNOSIS — R7989 Other specified abnormal findings of blood chemistry: Secondary | ICD-10-CM

## 2022-09-24 DIAGNOSIS — G43009 Migraine without aura, not intractable, without status migrainosus: Secondary | ICD-10-CM

## 2022-09-24 MED ORDER — ROSUVASTATIN CALCIUM 10 MG PO TABS: 10.0000 mg | ORAL_TABLET | Freq: Every day | ORAL | 11 refills | Status: DC

## 2022-09-24 MED ORDER — VITAMIN D (ERGOCALCIFEROL) 1.25 MG (50000 UNIT) PO CAPS: ORAL_CAPSULE | ORAL | 2 refills | Status: DC

## 2022-09-24 NOTE — Patient Instructions (Signed)
Start Rosuvastatin 10 mg daily Will recheck in 3 months  High Cholesterol  High cholesterol is a condition in which the blood has high levels of a white, waxy substance similar to fat (cholesterol). The liver makes all the cholesterol that the body needs. The human body needs small amounts of cholesterol to help build cells. A person gets extra or excess cholesterol from the food that he or she eats. The blood carries cholesterol from the liver to the rest of the body. If you have high cholesterol, deposits (plaques) may build up on the walls of your arteries. Arteries are the blood vessels that carry blood away from your heart. These plaques make the arteries narrow and stiff. Cholesterol plaques increase your risk for heart attack and stroke. Work with your health care provider to keep your cholesterol levels in a healthy range. What increases the risk? The following factors may make you more likely to develop this condition: Eating foods that are high in animal fat (saturated fat) or cholesterol. Being overweight. Not getting enough exercise. A family history of high cholesterol (familial hypercholesterolemia). Use of tobacco products. Having diabetes. What are the signs or symptoms? In most cases, high cholesterol does not usually cause any symptoms. In severe cases, very high cholesterol levels can cause: Fatty bumps under the skin (xanthomas). A white or gray ring around the black center (pupil) of the eye. How is this diagnosed? This condition may be diagnosed based on the results of a blood test. If you are older than 46 years of age, your health care provider may check your cholesterol levels every 4-6 years. You may be checked more often if you have high cholesterol or other risk factors for heart disease. The blood test for cholesterol measures: "Bad" cholesterol, or LDL cholesterol. This is the main type of cholesterol that causes heart disease. The desired level is less than  100 mg/dL (2.70 mmol/L). "Good" cholesterol, or HDL cholesterol. HDL helps protect against heart disease by cleaning the arteries and carrying the LDL to the liver for processing. The desired level for HDL is 60 mg/dL (6.23 mmol/L) or higher. Triglycerides. These are fats that your body can store or burn for energy. The desired level is less than 150 mg/dL (7.62 mmol/L). Total cholesterol. This measures the total amount of cholesterol in your blood and includes LDL, HDL, and triglycerides. The desired level is less than 200 mg/dL (8.31 mmol/L). How is this treated? Treatment for high cholesterol starts with lifestyle changes, such as diet and exercise. Diet changes. You may be asked to eat foods that have more fiber and less saturated fats or added sugar. Lifestyle changes. These may include regular exercise, maintaining a healthy weight, and quitting use of tobacco products. Medicines. These are given when diet and lifestyle changes have not worked. You may be prescribed a statin medicine to help lower your cholesterol levels. Follow these instructions at home: Eating and drinking  Eat a healthy, balanced diet. This diet includes: Daily servings of a variety of fresh, frozen, or canned fruits and vegetables. Daily servings of whole grain foods that are rich in fiber. Foods that are low in saturated fats and trans fats. These include poultry and fish without skin, lean cuts of meat, and low-fat dairy products. A variety of fish, especially oily fish that contain omega-3 fatty acids. Aim to eat fish at least 2 times a week. Avoid foods and drinks that have added sugar. Use healthy cooking methods, such as roasting, grilling, broiling, baking,  poaching, steaming, and stir-frying. Do not fry your food except for stir-frying. If you drink alcohol: Limit how much you have to: 0-1 drink a day for women who are not pregnant. 0-2 drinks a day for men. Know how much alcohol is in a drink. In the [place]  one drink equals one 12 oz bottle of beer (355 mL), one 5 oz glass of wine (148 mL), or one 1 oz glass of hard liquor (44 mL). Lifestyle  Get regular exercise. Aim to exercise for a total of 150 minutes a week. Increase your activity level by doing activities such as gardening, walking, and taking the stairs. Do not use any products that contain nicotine or tobacco. These products include cigarettes, chewing tobacco, and vaping devices, such as e-cigarettes. If you need help quitting, ask your health care provider. General instructions Take over-the-counter and prescription medicines only as told by your health care provider. Keep all follow-up visits. This is important. Where to find more information American Heart Association: www.heart.org National Heart, Lung, and Blood Institute: PopSte[employer].is Contact a health care provider if: You have trouble achieving or maintaining a healthy diet or weight. You are starting an exercise program. You are unable to stop smoking. Get help right away if: You have chest pain. You have trouble breathing. You have discomfort or pain in your jaw, neck, back, shoulder, or arm. You have any symptoms of a stroke. "BE FAST" is an easy way to remember the main warning signs of a stroke: B - Balance. Signs are dizziness, sudden trouble walking, or loss of balance. E - Eyes. Signs are trouble seeing or a sudden change in vision. F - Face. Signs are sudden weakness or numbness of the face, or the face or eyelid drooping on one side. A - Arms. Signs are weakness or numbness in an arm. This happens suddenly and usually on one side of the body. S - Speech. Signs are sudden trouble speaking, slurred speech, or trouble understanding what people say. T - Time. Time to call emergency services. Write down what time symptoms started. You have other signs of a stroke, such as: A sudden, severe headache with no known cause. Nausea or vomiting. Seizure. These symptoms  may represent a serious problem that is an emergency. Do not wait to see if the symptoms will go away. Get medical help right away. Call your local emergency services (911 in the [place] Do not drive yourself to the hospital. Summary Cholesterol plaques increase your risk for heart attack and stroke. Work with your health care provider to keep your cholesterol levels in a healthy range. Eat a healthy, balanced diet, get regular exercise, and maintain a healthy weight. Do not use any products that contain nicotine or tobacco. These products include cigarettes, chewing tobacco, and vaping devices, such as e-cigarettes. Get help right away if you have any symptoms of a stroke. This information is not intended to replace advice given to you by your health care provider. Make sure you discuss any questions you have with your health care provider. Document Revised: 04/17/2022 Document Reviewed: 11/18/2020 Elsevier Patient Education  2023 [employer].

## 2022-09-25 LAB — HEMOGLOBIN A1C
Hgb A1c MFr Bld: 5.5 % of total Hgb (ref <5.7)
Mean Plasma Glucose: 111 mg/dL
eAG (mmol/L): 6.2 mmol/L

## 2022-09-25 LAB — CBC WITH DIFFERENTIAL/PLATELET
Absolute Monocytes: 592 cells/uL (ref 200–950)
Basophils Absolute: 41 cells/uL (ref 0–200)
Basophils Relative: 0.6 %
Eosinophils Absolute: 163 cells/uL (ref 15–500)
Eosinophils Relative: 2.4 %
HCT: 41.7 % (ref 38.5–50.0)
Hemoglobin: 14.3 g/dL (ref 13.2–17.1)
Lymphs Abs: 2788 cells/uL (ref 850–3900)
MCH: 32.1 pg (ref 27.0–33.0)
MCHC: 34.3 g/dL (ref 32.0–36.0)
MCV: 93.5 fL (ref 80.0–100.0)
MPV: 9.9 fL (ref 7.5–12.5)
Monocytes Relative: 8.7 %
Neutro Abs: 3216 cells/uL (ref 1500–7800)
Neutrophils Relative %: 47.3 %
Platelets: 246 10*3/uL (ref 140–400)
RBC: 4.46 10*6/uL (ref 4.20–5.80)
RDW: 12.2 % (ref 11.0–15.0)
Total Lymphocyte: 41 %
WBC: 6.8 10*3/uL (ref 3.8–10.8)

## 2022-09-25 LAB — COMPLETE METABOLIC PANEL WITH GFR
AG Ratio: 1.8 (calc) (ref 1.0–2.5)
ALT: 17 U/L (ref 9–46)
AST: 18 U/L (ref 10–40)
Albumin: 4.4 g/dL (ref 3.6–5.1)
Alkaline phosphatase (APISO): 79 U/L (ref 36–130)
BUN: 15 mg/dL (ref 7–25)
CO2: 27 mmol/L (ref 20–32)
Calcium: 9.3 mg/dL (ref 8.6–10.3)
Chloride: 102 mmol/L (ref 98–110)
Creat: 1.06 mg/dL (ref 0.60–1.29)
Globulin: 2.5 g/dL (calc) (ref 1.9–3.7)
Glucose, Bld: 82 mg/dL (ref 65–99)
Potassium: 4 mmol/L (ref 3.5–5.3)
Sodium: 138 mmol/L (ref 135–146)
Total Bilirubin: 0.4 mg/dL (ref 0.2–1.2)
Total Protein: 6.9 g/dL (ref 6.1–8.1)
eGFR: 88 mL/min/{1.73_m2} (ref >=60)

## 2022-09-25 LAB — URINALYSIS, ROUTINE W REFLEX MICROSCOPIC
Bilirubin Urine: NEGATIVE
Glucose, UA: NEGATIVE
Hgb urine dipstick: NEGATIVE
Ketones, ur: NEGATIVE
Leukocytes,Ua: NEGATIVE
Nitrite: NEGATIVE
Protein, ur: NEGATIVE
Specific Gravity, Urine: 1.01 (ref 1.001–1.035)
pH: 6.5 (ref 5.0–8.0)

## 2022-09-25 LAB — MICROALBUMIN / CREATININE URINE RATIO
Creatinine, Urine: 60 mg/dL (ref 20–320)
Microalb, Ur: <0.2 mg/dL

## 2022-09-25 LAB — LIPID PANEL
Cholesterol: 228 mg/dL — ABNORMAL HIGH (ref <200)
HDL: 72 mg/dL (ref >=40)
LDL Cholesterol (Calc): 133 mg/dL (calc) — ABNORMAL HIGH
Non-HDL Cholesterol (Calc): 156 mg/dL (calc) — ABNORMAL HIGH (ref <130)
Total CHOL/HDL Ratio: 3.2 (calc) (ref <5.0)
Triglycerides: 120 mg/dL (ref <150)

## 2022-09-25 LAB — PSA: PSA: 0.37 ng/mL (ref <=4.00)

## 2022-09-25 LAB — MAGNESIUM: Magnesium: 2.1 mg/dL (ref 1.5–2.5)

## 2022-09-25 LAB — TSH: TSH: 1.4 mIU/L (ref 0.40–4.50)

## 2022-09-25 LAB — VITAMIN D 25 HYDROXY (VIT D DEFICIENCY, FRACTURES): Vit D, 25-Hydroxy: 37 ng/mL (ref 30–100)

## 2022-10-20 DIAGNOSIS — R41840 Attention and concentration deficit: Secondary | ICD-10-CM | POA: Diagnosis not present

## 2022-10-30 ENCOUNTER — Ambulatory Visit (INDEPENDENT_AMBULATORY_CARE_PROVIDER_SITE_OTHER): Payer: BC Managed Care – PPO | Admitting: Physician Assistant

## 2022-10-30 ENCOUNTER — Encounter: Payer: Self-pay | Admitting: Physician Assistant

## 2022-10-30 DIAGNOSIS — M25532 Pain in left wrist: Secondary | ICD-10-CM | POA: Diagnosis not present

## 2022-10-30 MED ORDER — METHYLPREDNISOLONE 4 MG PO TBPK: ORAL_TABLET | ORAL | 0 refills | Status: DC

## 2022-10-30 NOTE — Progress Notes (Signed)
Office Visit Note   Patient: Dale Conley           Date of Birth: 08/07/1976           MRN: [id number] Visit Date: 10/30/2022              Requested by: Unk Pinto, [address] [place],  [place] [address] PCP: Unk Pinto, MD  Chief Complaint  Patient presents with   Left Wrist - Pain      HPI: Mr. Dale Conley. is a pleasant 47 year old gentleman with a history of on and off dorsal left wrist pain.  He last saw Dr. Junius Roads a few years ago for bilateral wrist pain.  He was placed on a Medrol Dosepak which seemed to resolve his symptoms.  He said he was lifting something heavy a couple days ago and had return of his painful systems on the central dorsal of the left wrist.  Denies any paresthesias or weakness.  Has used topical medication on it but still continues to bother him.  Previous x-ray findings of his exam showed some synovitis  Assessment & Plan: Visit Diagnoses:  1. Pain in left wrist     Plan: Chronic left wrist pain.  We talked about options.  I will try him on a Medrol Dosepak.  Because has been going on for few years and he is extremely active I recommend he be evaluated by hand surgeon.  He has seen a Copy at [facility] for other issues.  We will refer him back there for evaluation.  He was counseled to take the Medrol Dosepak with food and not to take any other anti-inflammatories  Follow-Up Instructions: Return if symptoms worsen or fail to improve.   Ortho Exam  Patient is alert, oriented, no adenopathy, well-dressed, normal affect, normal respiratory effort. Examination of his left wrist he has full extension and flexion he has some tenderness over the dorsal scapholunate joint.  No instability findings.  Negative Tinel's sign no paresthesias no redness no signs of infection or cellulitis he is neurovascular intact  Imaging: No results found. No images are attached to the encounter.  Labs: Lab Results  Component Value Date    HGBA1C 5.5 09/24/2022   HGBA1C 5.5 09/24/2021   HGBA1C 5.2 09/24/2020   ESRSEDRATE 2 08/18/2019   CRP 0.2 08/18/2019   LABURIC 5.3 08/18/2019     Lab Results  Component Value Date   ALBUMIN 4.1 12/23/2020   ALBUMIN 4.3 05/04/2016   ALBUMIN 4.2 05/01/2015    Lab Results  Component Value Date   MG 2.1 09/24/2022   MG 2.3 09/24/2021   MG 2.0 09/24/2020   Lab Results  Component Value Date   VD25OH 37 09/24/2022   VD25OH 73 03/25/2022   VD25OH 34 09/24/2021    No results found for: "PREALBUMIN"    Latest Ref Rng & Units 09/24/2022    2:38 PM 03/25/2022    4:14 PM 09/24/2021    3:35 PM  CBC EXTENDED  WBC 3.8 - 10.8 Thousand/uL 6.8  6.8  8.1   RBC 4.20 - 5.80 Million/uL 4.46  4.96  4.85   Hemoglobin 13.2 - 17.1 g/dL 14.3  15.7  15.9   HCT 38.5 - 50.0 % 41.7  45.3  45.6   Platelets 140 - 400 Thousand/uL 246  243  235   NEUT# 1,500 - 7,800 cells/uL 3,216  3,509  5,792   Lymph# 850 - 3,900 cells/uL 2,788  2,468  1,466  There is no height or weight on file to calculate BMI.  Orders:  Orders Placed This Encounter  Procedures   Ambulatory referral to Orthopedic Surgery   Meds ordered this encounter  Medications   methylPREDNISolone (MEDROL DOSEPAK) 4 MG TBPK tablet    Sig: Take with food as directed    Dispense:  21 tablet    Refill:  0     Procedures: No procedures performed  Clinical Data: No additional findings.  ROS:  All other systems negative, except as noted in the HPI. Review of Systems  Objective: Vital Signs: There were no vitals taken for this visit.  Specialty Comments:  No specialty comments available.  PMFS History: Patient Active Problem List   Diagnosis Date Noted   Pain in left wrist 10/30/2022   AAA (abdominal aortic aneurysm) without rupture (HCC) 04/01/2022   Elevated LFTs 03/24/2019   Overweight (BMI 25.0-29.9) 03/23/2019   Hyperlipidemia 03/23/2019   Headache 09/22/2018   Obstructive sleep apnea syndrome 09/16/2017    Essential hypertension 05/04/2016   Vitamin D deficiency 05/04/2016   Medication management 05/04/2016   Hypogonadism in male 08/14/2015   Back pain 11/27/2014   Past Medical History:  Diagnosis Date   Sleep apnea     Family History  Problem Relation Age of Onset   Heart disease Father 92       CABG   Hypertension Father    Hyperlipidemia Father    Diabetes Paternal Grandfather     Past Surgical History:  Procedure Laterality Date   LUMBAR LAMINECTOMY/DECOMPRESSION MICRODISCECTOMY Left 12/25/2020   Procedure: LEFT-SIDED LUMBAR 5 - SACRUM 1 MICRODISECTOMY;  Surgeon: Phylliss Bob, MD;  Location: [facility];  Service: Orthopedics;  Laterality: Left;   TRIGGER FINGER RELEASE Right 04/11/2020   Procedure: RELEASE TRIGGER FINGER/A-1 PULLEY RIGHT MIDDLE FINGER;  Surgeon: Daryll Brod, MD;  Location: [facility];  Service: Orthopedics;  Laterality: Right;  IV REGIONAL FOREARM BLOCK   Social History   Occupational History   Not on file  Tobacco Use   Smoking status: Never   Smokeless tobacco: Never  Vaping Use   Vaping Use: Every day   Devices: SRX  Substance and Sexual Activity   Alcohol use: Yes    Alcohol/week: 14.0 standard drinks of alcohol    Types: 14 Standard drinks or equivalent per week    Comment: beer   Drug use: No   Sexual activity: Yes

## 2022-11-11 DIAGNOSIS — M25532 Pain in left wrist: Secondary | ICD-10-CM | POA: Diagnosis not present

## 2022-11-11 DIAGNOSIS — S63502A Unspecified sprain of left wrist, initial encounter: Secondary | ICD-10-CM | POA: Diagnosis not present

## 2022-12-11 ENCOUNTER — Encounter: Payer: Self-pay | Admitting: Nurse Practitioner

## 2022-12-11 ENCOUNTER — Ambulatory Visit: Payer: BC Managed Care – PPO | Admitting: Nurse Practitioner

## 2022-12-11 VITALS — BP 120/82 | HR 75 | Temp 97.3°F | Ht 70.0 in | Wt 182.6 lb

## 2022-12-11 DIAGNOSIS — K2960 Other gastritis without bleeding: Secondary | ICD-10-CM | POA: Diagnosis not present

## 2022-12-11 DIAGNOSIS — R140 Abdominal distension (gaseous): Secondary | ICD-10-CM | POA: Diagnosis not present

## 2022-12-11 DIAGNOSIS — R1013 Epigastric pain: Secondary | ICD-10-CM | POA: Diagnosis not present

## 2022-12-11 DIAGNOSIS — K921 Melena: Secondary | ICD-10-CM | POA: Diagnosis not present

## 2022-12-11 MED ORDER — OMEPRAZOLE 40 MG PO CPDR: DELAYED_RELEASE_CAPSULE | ORAL | 0 refills | Status: DC

## 2022-12-11 NOTE — Progress Notes (Unsigned)
Assessment and Plan:  Ralf Matesic was seen today for an episodic visit.  Diagnoses and all order for this visit:  NSAID induced gastritis Stop IBU Start daily PPI for added gastric protection. May take breakthrough H2 blocker (Pepcid) Discussed Tylenol up to 4,000 mg in 24 hours. Push fluids.   - H. pylori breath test - omeprazole (PRILOSEC) 40 MG capsule; Take 1 capsule Daily for Heartburn & Indigestion  Dispense: 90 capsule; Refill: 0  Epigastric pain  - H. pylori breath test - omeprazole (PRILOSEC) 40 MG capsule; Take 1 capsule Daily for Heartburn & Indigestion  Dispense: 90 capsule; Refill: 0  Bloating  Black tarry stools  - CBC with Differential/Platelet  Notify office for further evaluation and treatment, questions or concerns if s/s fail to improve. The risks and benefits of my recommendations, as well as other treatment options were discussed with the patient today. Questions were answered.  Further disposition pending results of labs. Discussed med's effects and SE's.    Over 25 minutes of exam, counseling, chart review, and critical decision making was performed.   Future Appointments  Date Time Provider [facility]  12/30/2022  2:30 PM Alycia Rossetti, NP [facility] None  09/28/2023  2:00 PM Alycia Rossetti, NP [facility] None    ------------------------------------------------------------------------------------------------------------------   HPI BP 120/82   Pulse 75   Temp (!) 97.3 F (36.3 C)   Ht 5\' 10"  (1.778 m)   Wt 182 lb 9.6 oz (82.8 kg)   SpO2 99%   BMI 26.20 kg/m    Less Chou is a 47 y.o. male presents for evaluation of abdominal pain. Onset of symptoms was abrupt starting 3 days ago with gradually improving course since that time. The pain is located epigastric without radiation. The pain is rated as severe, localized, and intermittent. The pain is made worse by ASA / NSAID use and food and is relieved by bland foods. The  patient also complains of bloating and nausea with black tarry stools. The patient denies anorexia, chills, constipation, diarrhea, dysuria, fever, frequency, headache, hematuria, melena, sweats, and vomiting. The past workup has included none. The pertinent past history includes chronic NSAID use 800 mg IBU daily with Meloxicam PRN. The patient denies PUD, Crohn's disease, ulcerative colitis, irritable bowel. Home care consisted of nothing.  Past Medical History:  Diagnosis Date   Sleep apnea      Allergies  Allergen Reactions   Flexeril [Cyclobenzaprine] Other (See Comments)    Makes pt groggy    Current Outpatient Medications on File Prior to Visit  Medication Sig   IBUPROFEN PO Take by mouth.   meloxicam (MOBIC) 15 MG tablet Take 1/2 to 1 tablet Daily with Food for Pain & Inflammation (Patient taking differently: as needed. Take 1/2 to 1 tablet Daily with Food for Pain & Inflammation)   methylPREDNISolone (MEDROL DOSEPAK) 4 MG TBPK tablet Take with food as directed   Multiple Vitamin (MULTIVITAMIN) tablet Take 1 tablet by mouth daily.   rosuvastatin (CRESTOR) 10 MG tablet Take 1 tablet (10 mg total) by mouth daily.   tiZANidine (ZANAFLEX) 4 MG tablet Take 1 tablet (4 mg total) by mouth every 6 (six) hours as needed.   VITAMIN D PO Take by mouth.   Vitamin D, Ergocalciferol, (DRISDOL) 1.25 MG (50000 UNIT) CAPS capsule 1 pill 1 days a week for vitamin d deficiency   No current facility-administered medications on file prior to visit.    ROS: all negative except what is noted in the  HPI.   Physical Exam:  BP 120/82   Pulse 75   Temp (!) 97.3 F (36.3 C)   Ht 5\' 10"  (1.778 m)   Wt 182 lb 9.6 oz (82.8 kg)   SpO2 99%   BMI 26.20 kg/m   General Appearance: NAD.  Awake, conversant and cooperative. Eyes: PERRLA, EOMs intact.  Sclera white.  Conjunctiva without erythema. Sinuses: No frontal/maxillary tenderness.  No nasal discharge. Nares patent.  ENT/Mouth: Ext aud canals  clear.  Bilateral TMs w/DOL and without erythema or bulging. Hearing intact.  Posterior pharynx without swelling or exudate.  Tonsils without swelling or erythema.  Neck: Supple.  No masses, nodules or thyromegaly. Respiratory: Effort is regular with non-labored breathing. Breath sounds are equal bilaterally without rales, rhonchi, wheezing or stridor.  Cardio: RRR with no MRGs. Brisk peripheral pulses without edema.  Abdomen: Active BS in all four quadrants.  Soft and tender along epigastric area without guarding, rebound tenderness, hernias or masses. Lymphatics: Non tender without lymphadenopathy.  Musculoskeletal: Full ROM, 5/5 strength, normal ambulation.  No clubbing or cyanosis. Skin: Appropriate color for ethnicity. Warm without rashes, lesions, ecchymosis, ulcers.  Neuro: CN II-XII grossly normal. Normal muscle tone without cerebellar symptoms and intact sensation.   Psych: AO X 3,  appropriate mood and affect, insight and judgment.     Darrol Jump, NP 11:35 AM [facility]

## 2022-12-11 NOTE — Patient Instructions (Signed)
Peptic Ulcer  A peptic ulcer is a sore in the lining of the stomach (gastric ulcer) or the first part of the small intestine (duodenal ulcer). The ulcer causes a gradual wearing away (erosion) of the deeper tissue. What are the causes? Normally, the lining of the stomach and the small intestine protects them from the acid that digests food. The protective lining can be damaged by: An infection caused by a type of bacteria called Helicobacter pylori or H. pylori. Regular use of NSAIDs, such as ibuprofen or aspirin. Rare tumors in the stomach, small intestine, or pancreas (Zollinger-Ellison syndrome). What increases the risk? The following factors may make you more likely to develop this condition: Smoking. Having a family history of ulcer disease. Drinking alcohol. Having been hospitalized in an intensive care unit (ICU). What are the signs or symptoms? Symptoms of this condition include: Persistent burning pain in the area between the chest and the belly button. The pain may be worse on an empty stomach and at night. Heartburn. Nausea and vomiting. Bloating. If the ulcer results in bleeding, it can cause: Black, tarry stools. Vomiting of bright red blood. Vomiting of material that looks like coffee grounds. How is this diagnosed? This condition may be diagnosed based on: Your medical history and a physical exam. Various tests or procedures, such as: Upper endoscopy. The health care provider examines the esophagus, stomach, and small intestine using a small flexible tube that has a video camera at the end. Blood tests, stool tests, or breath tests to check for the H. pylori bacteria. An X-ray exam (upper gastrointestinal series) of the esophagus, stomach, and small intestine. A biopsy to help find certain causes of ulcers. A tissue sample is removed during upper endoscopy to be examined under a microscope. How is this treated? Treatment for this condition may include: Eliminating the  cause of the ulcer, such as smoking or use of NSAIDs, and limiting alcohol and caffeine intake. Medicines to reduce the amount of acid in your digestive tract. Antibiotic medicines, if the ulcer is caused by an H. pylori infection. An upper endoscopy may be used to treat a bleeding ulcer. Surgery. This may be needed if the bleeding is severe or if the ulcer created a hole somewhere in the digestive system. Follow these instructions at home: Do not drink alcohol if your health care provider tells you not to drink. Do not use any products that contain nicotine or tobacco. These products include cigarettes, chewing tobacco, and vaping devices, such as e-cigarettes. If you need help quitting, ask your health care provider. Take over-the-counter and prescription medicines only as told by your health care provider. Do not use over-the-counter medicines in place of prescription medicines unless your health care provider approves. Do not take aspirin, ibuprofen, or other NSAIDs unless your health care provider tells you to. Keep all follow-up visits. This is important. Contact a health care provider if: Your symptoms do not improve within 7 days of starting treatment. You have ongoing indigestion or heartburn. Get help right away if: You have sudden, sharp, or persistent pain in your abdomen. You have bloody or dark black, tarry stools. You vomit blood or material that looks like coffee grounds. You become light-headed or you feel faint. You become weak. You become sweaty or clammy. These symptoms may be an emergency. Get help right away. Call 911. Do not wait to see if the symptoms will go away. Do not drive yourself to the hospital. Summary A peptic ulcer is a sore   in the lining of the stomach (gastric ulcer) or the first part of the small intestine (duodenal ulcer). The ulcer causes a gradual wearing away (erosion) of the deeper tissue. Do not use any products that contain nicotine or tobacco.  These products include cigarettes, chewing tobacco, and vaping devices, such as e-cigarettes. If you need help quitting, ask your health care provider. Take over-the-counter and prescription medicines only as told by your health care provider. Do not use over-the-counter medicines in place of prescription medicines unless your health care provider approves. Limit your alcohol and caffeine intake. Keep all follow-up visits. This is important. This information is not intended to replace advice given to you by your health care provider. Make sure you discuss any questions you have with your health care provider. Document Revised: 04/25/2021 Document Reviewed: 04/25/2021 Elsevier Patient Education  2023 [employer]  

## 2022-12-15 LAB — CBC WITH DIFFERENTIAL/PLATELET
Absolute Monocytes: 532 cells/uL (ref 200–950)
Basophils Absolute: 38 cells/uL (ref 0–200)
Basophils Relative: 0.5 %
Eosinophils Absolute: 84 cells/uL (ref 15–500)
Eosinophils Relative: 1.1 %
HCT: 42.2 % (ref 38.5–50.0)
Hemoglobin: 14.6 g/dL (ref 13.2–17.1)
Lymphs Abs: 2227 cells/uL (ref 850–3900)
MCH: 31.9 pg (ref 27.0–33.0)
MCHC: 34.6 g/dL (ref 32.0–36.0)
MCV: 92.3 fL (ref 80.0–100.0)
MPV: 9.7 fL (ref 7.5–12.5)
Monocytes Relative: 7 %
Neutro Abs: 4720 cells/uL (ref 1500–7800)
Neutrophils Relative %: 62.1 %
Platelets: 233 10*3/uL (ref 140–400)
RBC: 4.57 10*6/uL (ref 4.20–5.80)
RDW: 11.9 % (ref 11.0–15.0)
Total Lymphocyte: 29.3 %
WBC: 7.6 10*3/uL (ref 3.8–10.8)

## 2022-12-15 LAB — H. PYLORI BREATH TEST: H. pylori Breath Test: NOT DETECTED

## 2022-12-23 DIAGNOSIS — R41840 Attention and concentration deficit: Secondary | ICD-10-CM | POA: Diagnosis not present

## 2022-12-29 NOTE — Progress Notes (Deleted)
FOLLOW UP  Assessment and Plan:   Dale Conley was seen today for migraine.  Diagnoses and all orders for this visit:  Essential hypertension -     CBC with Differential/Platelet -  DASH diet, exercise and monitor at home. Call if greater than 130/80.    Mixed hyperlipidemia -     COMPLETE METABOLIC PANEL WITH GFR -     Lipid panel -     TSH - Stressed importance of diet and exercise  Overweight (BMI 25.0-29.9)  Continue diet and exercise   Migraine without aura and without status migrainosus, not intractable -     rizatriptan (MAXALT) 10 MG tablet; Take 1 tablet (10 mg total) by mouth as needed for migraine. May repeat in 2 hours if needed Monitor migraines, if not controlled with triptan and having more than 8 migraines a month will consider switching to [employer] and meds as discussed. Further disposition pending results of labs. Discussed med's effects and SE's.   Over 30 minutes of exam, counseling, chart review, and critical decision making was performed.   Future Appointments  Date Time Provider Mack  12/30/2022  2:30 PM Alycia Rossetti, NP [facility] None  09/28/2023  2:00 PM Alycia Rossetti, NP [facility] None    ----------------------------------------------------------------------------------------------------------------------  HPI 47 y.o. male  presents for 3 month follow up on hypertension, cholesterol,  weight and vitamin D deficiency. Also having migraines  He is having headaches that occur in frontal region and behind eyes. He takes Excedrin migraine and will help for short time and then comes right back.Headaches daily with migraines 2-3 times a week. Has light sensitivity. No food triggers.  BMI is There is no height or weight on file to calculate BMI., he has been working on diet and exercise. Wt Readings from Last 3 Encounters:  12/11/22 182 lb 9.6 oz (82.8 kg)  09/24/22 184 lb 9.6 oz (83.7 kg)  08/17/22 187 lb 3.2 oz  (84.9 kg)    His blood pressure has been controlled at home, today their BP is   BP Readings from Last 3 Encounters:  12/11/22 120/82  09/24/22 130/84  08/17/22 138/80     He does workout. He denies chest pain, shortness of breath, dizziness.   He is not on cholesterol medication   His cholesterol is not at goal. The cholesterol last visit was:   Lab Results  Component Value Date   CHOL 228 (H) 09/24/2022   HDL 72 09/24/2022   LDLCALC 133 (H) 09/24/2022   TRIG 120 09/24/2022   CHOLHDL 3.2 09/24/2022     Patient is on Vitamin D supplement.   Lab Results  Component Value Date   VD25OH 37 09/24/2022        Current Medications:  Current Outpatient Medications on File Prior to Visit  Medication Sig   IBUPROFEN PO Take by mouth.   meloxicam (MOBIC) 15 MG tablet Take 1/2 to 1 tablet Daily with Food for Pain & Inflammation (Patient taking differently: as needed. Take 1/2 to 1 tablet Daily with Food for Pain & Inflammation)   Multiple Vitamin (MULTIVITAMIN) tablet Take 1 tablet by mouth daily.   omeprazole (PRILOSEC) 40 MG capsule Take 1 capsule Daily for Heartburn & Indigestion   rosuvastatin (CRESTOR) 10 MG tablet Take 1 tablet (10 mg total) by mouth daily.   tiZANidine (ZANAFLEX) 4 MG tablet Take 1 tablet (4 mg total) by mouth every 6 (six) hours as needed.  VITAMIN D PO Take by mouth.   Vitamin D, Ergocalciferol, (DRISDOL) 1.25 MG (50000 UNIT) CAPS capsule 1 pill 1 days a week for vitamin d deficiency   No current facility-administered medications on file prior to visit.     Allergies:  Allergies  Allergen Reactions   Flexeril [Cyclobenzaprine] Other (See Comments)    Makes pt groggy     Medical History:  Past Medical History:  Diagnosis Date   Sleep apnea    Family history- Reviewed and unchanged Social history- Reviewed and unchanged   Review of Systems:  Review of Systems  Constitutional:  Negative for chills and fever.  HENT:  Positive for tinnitus.  Negative for congestion, hearing loss, sinus pain and sore throat.   Eyes:  Positive for blurred vision (Occ with migraines).  Respiratory:  Negative for cough and hemoptysis.   Cardiovascular:  Negative for chest pain, palpitations and leg swelling.  Gastrointestinal:  Negative for abdominal pain, diarrhea, heartburn, nausea and vomiting.  Genitourinary:  Negative for dysuria.  Musculoskeletal:  Positive for back pain (rare).  Neurological:  Positive for headaches. Negative for dizziness.  Psychiatric/Behavioral:  Negative for depression. The patient does not have insomnia.       Physical Exam: There were no vitals taken for this visit. Wt Readings from Last 3 Encounters:  12/11/22 182 lb 9.6 oz (82.8 kg)  09/24/22 184 lb 9.6 oz (83.7 kg)  08/17/22 187 lb 3.2 oz (84.9 kg)   General Appearance: Well nourished, in no apparent distress. Eyes: PERRLA, EOMs, conjunctiva no swelling or erythema Sinuses: No Frontal/maxillary tenderness ENT/Mouth: Ext aud canals clear, TMs without erythema, bulging. No erythema, swelling, or exudate on post pharynx.  Tonsils not swollen or erythematous. Hearing normal.  Neck: Supple, thyroid normal.  Respiratory: Respiratory effort normal, BS equal bilaterally without rales, rhonchi, wheezing or stridor.  Cardio: RRR with no MRGs. Brisk peripheral pulses without edema.  Abdomen: Soft, + BS.  Non tender, no guarding, rebound, hernias, masses. Lymphatics: Non tender without lymphadenopathy.  Musculoskeletal: Full ROM, 5/5 strength, Normal gait Skin: Warm, dry without rashes, lesions, ecchymosis.  Neuro: Cranial nerves intact. No cerebellar symptoms.  Psych: Awake and oriented X 3, normal affect, Insight and Judgment appropriate.    Alycia Rossetti, NP 12:08 PM [facility]

## 2022-12-30 ENCOUNTER — Ambulatory Visit: Payer: BC Managed Care – PPO | Admitting: Nurse Practitioner

## 2022-12-30 DIAGNOSIS — E559 Vitamin D deficiency, unspecified: Secondary | ICD-10-CM

## 2022-12-30 DIAGNOSIS — I10 Essential (primary) hypertension: Secondary | ICD-10-CM

## 2022-12-30 DIAGNOSIS — E663 Overweight: Secondary | ICD-10-CM

## 2022-12-30 DIAGNOSIS — I714 Abdominal aortic aneurysm, without rupture, unspecified: Secondary | ICD-10-CM

## 2022-12-30 DIAGNOSIS — G43009 Migraine without aura, not intractable, without status migrainosus: Secondary | ICD-10-CM

## 2022-12-30 DIAGNOSIS — E782 Mixed hyperlipidemia: Secondary | ICD-10-CM

## 2022-12-30 DIAGNOSIS — Z79899 Other long term (current) drug therapy: Secondary | ICD-10-CM

## 2023-01-07 NOTE — Progress Notes (Signed)
 FOLLOW UP  Assessment and Plan:   Dale Conley was seen today for migraine.  Diagnoses and all orders for this visit:  Essential hypertension -     CBC with Differential/Platelet -  Continue DASH diet, exercise and monitor at home. Call if greater than 130/80.    Mixed hyperlipidemia -     COMPLETE METABOLIC PANEL WITH GFR -     Lipid panel -     TSH - Stressed importance of diet and exercise  Overweight (BMI 25.0-29.9) Long discussion about weight loss, diet, and exercise Recommended diet heavy in fruits and veggies and low in animal meats, cheeses, and dairy products, appropriate calorie intake Patient will work on decreasing saturated fats and simple carbs Follow up at next visit - TSH  Abdominal Aortic Aneurysm Found on 03/2022 Abdominal U/S, repeat U/S is due 2026  Migraine without aura and without status migrainosus, not intractable Monitor migraines,only having 2 / month and using Excedrin which provides relief   Low Back Pain - Use tizanidine as needed, limit to 5 days a week  Abnormal Glucose Continue diet and exercise - CMP  Vitamin D Deficiency Continue Vit D supplementation to maintain value in therapeutic level of 60-100   Medication Management Magnesium, TSH   Continue diet and meds as discussed. Further disposition pending results of labs. Discussed med's effects and SE's.   Over 30 minutes of exam, counseling, chart review, and critical decision making was performed.   Future Appointments  Date Time Provider Department Center  09/28/2023  2:00 PM [name], NP [facility] None    ----------------------------------------------------------------------------------------------------------------------  HPI 47 y.o. male  presents for 3 month follow up on hypertension, cholesterol,  weight and vitamin D deficiency. Also having migraines  He is having headaches that occur in frontal region and behind eyes. He takes Excedrin migraine and will help  for short time and then comes right back.Headaches daily with migraines 2-3 times a month. Has light sensitivity. No food triggers.  Heartburn symptoms have resolved.   BMI is Body mass index is 26.52 kg/m., he has been working on diet and exercise. Wt Readings from Last 3 Encounters:  01/11/23 184 lb 12.8 oz (83.8 kg)  12/11/22 182 lb 9.6 oz (82.8 kg)  09/24/22 184 lb 9.6 oz (83.7 kg)    His blood pressure has been controlled at home, today their BP is BP: 128/88 BP Readings from Last 3 Encounters:  01/11/23 128/88  12/11/22 120/82  09/24/22 130/84   He does workout. He denies chest pain, shortness of breath, dizziness.   He is on cholesterol medication, rosuvastatin 10 mg which was started 08/2022- will occasionally forget   His cholesterol is not at goal. The cholesterol last visit was:   Lab Results  Component Value Date   CHOL 228 (H) 09/24/2022   HDL 72 09/24/2022   LDLCALC 133 (H) 09/24/2022   TRIG 120 09/24/2022   CHOLHDL 3.2 09/24/2022   His back pain is intermittent in lumbar area, the tizanidine does help. He also previously had a cream he was using on his back and shoulders for pain and is almost out of the cream.  Will send me a picture of the medication to determine if appropriate for refill.   Strattera does help with ADD. Much better with use of medication.   Patient is on Vitamin D supplement.   Lab Results  Component Value Date   VD25OH 37 09/24/2022        Current Medications:  Current  Outpatient Medications on File Prior to Visit  Medication Sig   Multiple Vitamin (MULTIVITAMIN) tablet Take 1 tablet by mouth daily.   omeprazole (PRILOSEC) 40 MG capsule Take 1 capsule Daily for Heartburn & Indigestion   rosuvastatin (CRESTOR) 10 MG tablet Take 1 tablet (10 mg total) by mouth daily.   tiZANidine (ZANAFLEX) 4 MG tablet Take 1 tablet (4 mg total) by mouth every 6 (six) hours as needed.   Vitamin D, Ergocalciferol, (DRISDOL) 1.25 MG (50000 UNIT) CAPS  capsule 1 pill 1 days a week for vitamin d deficiency   atomoxetine (STRATTERA) 40 MG capsule Take 40 mg by mouth every morning.   IBUPROFEN PO Take by mouth. (Patient not taking: Reported on 01/11/2023)   meloxicam (MOBIC) 15 MG tablet Take 1/2 to 1 tablet Daily with Food for Pain & Inflammation (Patient not taking: Reported on 01/11/2023)   VITAMIN D PO Take by mouth. (Patient not taking: Reported on 01/11/2023)   No current facility-administered medications on file prior to visit.     Allergies:  Allergies  Allergen Reactions   Flexeril [Cyclobenzaprine] Other (See Comments)    Makes pt groggy     Medical History:  Past Medical History:  Diagnosis Date   Sleep apnea    Family history- Reviewed and unchanged Social history- Reviewed and unchanged   Review of Systems:  Review of Systems  Constitutional:  Negative for chills and fever.  HENT:  Positive for tinnitus. Negative for congestion, hearing loss, sinus pain and sore throat.   Eyes:  Negative for blurred vision.  Respiratory:  Negative for cough and hemoptysis.   Cardiovascular:  Negative for chest pain, palpitations and leg swelling.  Gastrointestinal:  Negative for abdominal pain, diarrhea, heartburn, nausea and vomiting.  Genitourinary:  Negative for dysuria.  Musculoskeletal:  Positive for back pain (rare) and joint pain (shoulders).  Skin: Negative.   Neurological:  Positive for headaches. Negative for dizziness.  Endo/Heme/Allergies:  Does not bruise/bleed easily.  Psychiatric/Behavioral:  Negative for depression. The patient does not have insomnia.       Physical Exam: BP 128/88   Pulse 81   Temp (!) 97.5 F (36.4 C)   Ht 5\' 10"  (1.778 m)   Wt 184 lb 12.8 oz (83.8 kg)   SpO2 98%   BMI 26.52 kg/m  Wt Readings from Last 3 Encounters:  01/11/23 184 lb 12.8 oz (83.8 kg)  12/11/22 182 lb 9.6 oz (82.8 kg)  09/24/22 184 lb 9.6 oz (83.7 kg)   General Appearance: Well nourished, in no apparent  distress. Eyes: PERRLA, EOMs, conjunctiva no swelling or erythema Sinuses: No Frontal/maxillary tenderness ENT/Mouth: Ext aud canals clear, TMs without erythema, bulging. No erythema, swelling, or exudate on post pharynx.  Tonsils not swollen or erythematous. Hearing normal.  Neck: Supple, thyroid normal.  Respiratory: Respiratory effort normal, BS equal bilaterally without rales, rhonchi, wheezing or stridor.  Cardio: RRR with no MRGs. Brisk peripheral pulses without edema.  Abdomen: Soft, + BS.  Non tender, no guarding, rebound, hernias, masses. Lymphatics: Non tender without lymphadenopathy.  Musculoskeletal: Full ROM, 5/5 strength, Normal gait Skin: Warm, dry without rashes, lesions, ecchymosis.  Neuro: Cranial nerves intact. No cerebellar symptoms.  Psych: Awake and oriented X 3, normal affect, Insight and Judgment appropriate.    Raynelle DickANA E , NP 4:11 PM [facility]

## 2023-01-11 ENCOUNTER — Ambulatory Visit: Payer: BC Managed Care – PPO | Admitting: Nurse Practitioner

## 2023-01-11 ENCOUNTER — Encounter: Payer: Self-pay | Admitting: Nurse Practitioner

## 2023-01-11 VITALS — BP 128/88 | HR 81 | Temp 97.5°F | Ht 70.0 in | Wt 184.8 lb

## 2023-01-11 DIAGNOSIS — M545 Low back pain, unspecified: Secondary | ICD-10-CM

## 2023-01-11 DIAGNOSIS — E782 Mixed hyperlipidemia: Secondary | ICD-10-CM | POA: Diagnosis not present

## 2023-01-11 DIAGNOSIS — I10 Essential (primary) hypertension: Secondary | ICD-10-CM

## 2023-01-11 DIAGNOSIS — E663 Overweight: Secondary | ICD-10-CM | POA: Diagnosis not present

## 2023-01-11 DIAGNOSIS — I714 Abdominal aortic aneurysm, without rupture, unspecified: Secondary | ICD-10-CM

## 2023-01-11 DIAGNOSIS — Z79899 Other long term (current) drug therapy: Secondary | ICD-10-CM

## 2023-01-11 DIAGNOSIS — R7309 Other abnormal glucose: Secondary | ICD-10-CM

## 2023-01-11 DIAGNOSIS — G43009 Migraine without aura, not intractable, without status migrainosus: Secondary | ICD-10-CM

## 2023-01-11 DIAGNOSIS — E559 Vitamin D deficiency, unspecified: Secondary | ICD-10-CM

## 2023-01-11 MED ORDER — TIZANIDINE HCL 4 MG PO TABS: 4.0000 mg | ORAL_TABLET | Freq: Four times a day (QID) | ORAL | 2 refills | Status: DC | PRN

## 2023-01-11 MED ORDER — VITAMIN D (ERGOCALCIFEROL) 1.25 MG (50000 UNIT) PO CAPS
ORAL_CAPSULE | ORAL | 2 refills | Status: AC
Start: 2023-01-11 — End: ?

## 2023-01-11 NOTE — Patient Instructions (Signed)

## 2023-01-12 LAB — CBC WITH DIFFERENTIAL/PLATELET
Absolute Monocytes: 593 cells/uL (ref 200–950)
Basophils Absolute: 48 cells/uL (ref 0–200)
Basophils Relative: 0.7 %
Eosinophils Absolute: 131 cells/uL (ref 15–500)
Eosinophils Relative: 1.9 %
HCT: 43.5 % (ref 38.5–50.0)
Hemoglobin: 14.7 g/dL (ref 13.2–17.1)
Lymphs Abs: 2753 cells/uL (ref 850–3900)
MCH: 31.3 pg (ref 27.0–33.0)
MCHC: 33.8 g/dL (ref 32.0–36.0)
MCV: 92.6 fL (ref 80.0–100.0)
MPV: 9.4 fL (ref 7.5–12.5)
Monocytes Relative: 8.6 %
Neutro Abs: 3374 cells/uL (ref 1500–7800)
Neutrophils Relative %: 48.9 %
Platelets: 248 10*3/uL (ref 140–400)
RBC: 4.7 10*6/uL (ref 4.20–5.80)
RDW: 11.7 % (ref 11.0–15.0)
Total Lymphocyte: 39.9 %
WBC: 6.9 10*3/uL (ref 3.8–10.8)

## 2023-01-12 LAB — LIPID PANEL
Cholesterol: 196 mg/dL (ref <200)
HDL: 84 mg/dL (ref >=40)
LDL Cholesterol (Calc): 98 mg/dL (calc)
Non-HDL Cholesterol (Calc): 112 mg/dL (calc) (ref <130)
Total CHOL/HDL Ratio: 2.3 (calc) (ref <5.0)
Triglycerides: 55 mg/dL (ref <150)

## 2023-01-12 LAB — COMPLETE METABOLIC PANEL WITH GFR
AG Ratio: 1.9 (calc) (ref 1.0–2.5)
ALT: 20 U/L (ref 9–46)
AST: 20 U/L (ref 10–40)
Albumin: 4.8 g/dL (ref 3.6–5.1)
Alkaline phosphatase (APISO): 75 U/L (ref 36–130)
BUN: 17 mg/dL (ref 7–25)
CO2: 28 mmol/L (ref 20–32)
Calcium: 9.8 mg/dL (ref 8.6–10.3)
Chloride: 102 mmol/L (ref 98–110)
Creat: 0.92 mg/dL (ref 0.60–1.29)
Globulin: 2.5 g/dL (calc) (ref 1.9–3.7)
Glucose, Bld: 81 mg/dL (ref 65–99)
Potassium: 4.3 mmol/L (ref 3.5–5.3)
Sodium: 139 mmol/L (ref 135–146)
Total Bilirubin: 0.5 mg/dL (ref 0.2–1.2)
Total Protein: 7.3 g/dL (ref 6.1–8.1)
eGFR: 104 mL/min/{1.73_m2} (ref >=60)

## 2023-01-12 LAB — MAGNESIUM: Magnesium: 2.1 mg/dL (ref 1.5–2.5)

## 2023-01-12 LAB — TSH: TSH: 1.38 mIU/L (ref 0.40–4.50)

## 2023-03-18 ENCOUNTER — Other Ambulatory Visit: Payer: Self-pay | Admitting: Nurse Practitioner

## 2023-03-18 DIAGNOSIS — M545 Low back pain, unspecified: Secondary | ICD-10-CM

## 2023-05-24 ENCOUNTER — Other Ambulatory Visit: Payer: Self-pay | Admitting: Nurse Practitioner

## 2023-05-24 DIAGNOSIS — R1013 Epigastric pain: Secondary | ICD-10-CM

## 2023-05-24 DIAGNOSIS — K2960 Other gastritis without bleeding: Secondary | ICD-10-CM

## 2023-06-09 DIAGNOSIS — M9903 Segmental and somatic dysfunction of lumbar region: Secondary | ICD-10-CM | POA: Diagnosis not present

## 2023-06-09 DIAGNOSIS — M9905 Segmental and somatic dysfunction of pelvic region: Secondary | ICD-10-CM | POA: Diagnosis not present

## 2023-06-09 DIAGNOSIS — M5386 Other specified dorsopathies, lumbar region: Secondary | ICD-10-CM | POA: Diagnosis not present

## 2023-06-09 DIAGNOSIS — M47816 Spondylosis without myelopathy or radiculopathy, lumbar region: Secondary | ICD-10-CM | POA: Diagnosis not present

## 2023-06-09 DIAGNOSIS — M5136 Other intervertebral disc degeneration, lumbar region: Secondary | ICD-10-CM | POA: Diagnosis not present

## 2023-06-09 DIAGNOSIS — M9902 Segmental and somatic dysfunction of thoracic region: Secondary | ICD-10-CM | POA: Diagnosis not present

## 2023-06-11 DIAGNOSIS — M9905 Segmental and somatic dysfunction of pelvic region: Secondary | ICD-10-CM | POA: Diagnosis not present

## 2023-06-11 DIAGNOSIS — M47816 Spondylosis without myelopathy or radiculopathy, lumbar region: Secondary | ICD-10-CM | POA: Diagnosis not present

## 2023-06-11 DIAGNOSIS — M9902 Segmental and somatic dysfunction of thoracic region: Secondary | ICD-10-CM | POA: Diagnosis not present

## 2023-06-11 DIAGNOSIS — M9903 Segmental and somatic dysfunction of lumbar region: Secondary | ICD-10-CM | POA: Diagnosis not present

## 2023-06-11 DIAGNOSIS — M5136 Other intervertebral disc degeneration, lumbar region: Secondary | ICD-10-CM | POA: Diagnosis not present

## 2023-06-11 DIAGNOSIS — M5386 Other specified dorsopathies, lumbar region: Secondary | ICD-10-CM | POA: Diagnosis not present

## 2023-06-17 DIAGNOSIS — M9903 Segmental and somatic dysfunction of lumbar region: Secondary | ICD-10-CM | POA: Diagnosis not present

## 2023-06-17 DIAGNOSIS — M47816 Spondylosis without myelopathy or radiculopathy, lumbar region: Secondary | ICD-10-CM | POA: Diagnosis not present

## 2023-06-17 DIAGNOSIS — M9902 Segmental and somatic dysfunction of thoracic region: Secondary | ICD-10-CM | POA: Diagnosis not present

## 2023-06-17 DIAGNOSIS — M5386 Other specified dorsopathies, lumbar region: Secondary | ICD-10-CM | POA: Diagnosis not present

## 2023-06-17 DIAGNOSIS — M9905 Segmental and somatic dysfunction of pelvic region: Secondary | ICD-10-CM | POA: Diagnosis not present

## 2023-06-17 DIAGNOSIS — M5136 Other intervertebral disc degeneration, lumbar region: Secondary | ICD-10-CM | POA: Diagnosis not present

## 2023-07-12 NOTE — Progress Notes (Deleted)
FOLLOW UP  Assessment and Plan:   Dale Conley was seen today for migraine.  Diagnoses and all orders for this visit:  Essential hypertension -     CBC with Differential/Platelet -  Continue DASH diet, exercise and monitor at home. Call if greater than 130/80.    Mixed hyperlipidemia -     COMPLETE METABOLIC PANEL WITH GFR -     Lipid panel -     TSH - Stressed importance of diet and exercise  Overweight (BMI 25.0-29.9) Long discussion about weight loss, diet, and exercise Recommended diet heavy in fruits and veggies and low in animal meats, cheeses, and dairy products, appropriate calorie intake Patient will work on decreasing saturated fats and simple carbs Follow up at next visit - TSH  Abdominal Aortic Aneurysm Found on 03/2022 Abdominal U/S, repeat U/S is due 2026  Migraine without aura and without status migrainosus, not intractable Monitor migraines,only having 2 / month and using Excedrin which provides relief   Low Back Pain - Use tizanidine as needed, limit to 5 days a week  Abnormal Glucose Continue diet and exercise - CMP  Vitamin D Deficiency Continue Vit D supplementation to maintain value in therapeutic level of 60-100   Medication Management Magnesium, TSH   Continue diet and meds as discussed. Further disposition pending results of labs. Discussed med's effects and SE's.   Over 30 minutes of exam, counseling, chart review, and critical decision making was performed.   Future Appointments  Date Time Provider Department Center  07/13/2023  4:00 PM [name], NP [facility] None  09/28/2023  2:00 PM [name], NP [facility] None    ----------------------------------------------------------------------------------------------------------------------  HPI 47 y.o. male  presents for 3 month follow up on hypertension, cholesterol,  weight and vitamin D deficiency. Also having migraines  He is having headaches that occur in frontal region  and behind eyes. He takes Excedrin migraine and will help for short time and then comes right back.Headaches daily with migraines 2-3 times a month. Has light sensitivity. No food triggers.  Heartburn symptoms have resolved.   BMI is There is no height or weight on file to calculate BMI., he has been working on diet and exercise. Wt Readings from Last 3 Encounters:  01/11/23 184 lb 12.8 oz (83.8 kg)  12/11/22 182 lb 9.6 oz (82.8 kg)  09/24/22 184 lb 9.6 oz (83.7 kg)    His blood pressure has been controlled at home, today their BP is   BP Readings from Last 3 Encounters:  01/11/23 128/88  12/11/22 120/82  09/24/22 130/84   He does workout. He denies chest pain, shortness of breath, dizziness.   He is on cholesterol medication, rosuvastatin 10 mg which was started 08/2022- will occasionally forget   His cholesterol is not at goal. The cholesterol last visit was:   Lab Results  Component Value Date   CHOL 196 01/11/2023   HDL 84 01/11/2023   LDLCALC 98 01/11/2023   TRIG 55 01/11/2023   CHOLHDL 2.3 01/11/2023   His back pain is intermittent in lumbar area, the tizanidine does help. He also previously had a cream he was using on his back and shoulders for pain and is almost out of the cream.  Will send me a picture of the medication to determine if appropriate for refill.   Strattera does help with ADD. Much better with use of medication.   Patient is on Vitamin D supplement.   Lab Results  Component Value Date  VD25OH 37 09/24/2022        Current Medications:  Current Outpatient Medications on File Prior to Visit  Medication Sig   atomoxetine (STRATTERA) 40 MG capsule Take 40 mg by mouth every morning.   Multiple Vitamin (MULTIVITAMIN) tablet Take 1 tablet by mouth daily.   omeprazole (PRILOSEC) 40 MG capsule TAKE 1 CAPSULE BY MOUTH DAILY FOR HEARTBURN OR INDIGESTION   rosuvastatin (CRESTOR) 10 MG tablet Take 1 tablet (10 mg total) by mouth daily.   tiZANidine (ZANAFLEX) 4  MG tablet TAKE 1 TABLET(4 MG) BY MOUTH EVERY 6 HOURS AS NEEDED   Vitamin D, Ergocalciferol, (DRISDOL) 1.25 MG (50000 UNIT) CAPS capsule 1 pill 1 days a week for vitamin d deficiency   No current facility-administered medications on file prior to visit.     Allergies:  Allergies  Allergen Reactions   Flexeril [Cyclobenzaprine] Other (See Comments)    Makes pt groggy     Medical History:  Past Medical History:  Diagnosis Date   Sleep apnea    Family history- Reviewed and unchanged Social history- Reviewed and unchanged   Review of Systems:  Review of Systems  Constitutional:  Negative for chills and fever.  HENT:  Positive for tinnitus. Negative for congestion, hearing loss, sinus pain and sore throat.   Eyes:  Negative for blurred vision.  Respiratory:  Negative for cough and hemoptysis.   Cardiovascular:  Negative for chest pain, palpitations and leg swelling.  Gastrointestinal:  Negative for abdominal pain, diarrhea, heartburn, nausea and vomiting.  Genitourinary:  Negative for dysuria.  Musculoskeletal:  Positive for back pain (rare) and joint pain (shoulders).  Skin: Negative.   Neurological:  Positive for headaches. Negative for dizziness.  Endo/Heme/Allergies:  Does not bruise/bleed easily.  Psychiatric/Behavioral:  Negative for depression. The patient does not have insomnia.       Physical Exam: There were no vitals taken for this visit. Wt Readings from Last 3 Encounters:  01/11/23 184 lb 12.8 oz (83.8 kg)  12/11/22 182 lb 9.6 oz (82.8 kg)  09/24/22 184 lb 9.6 oz (83.7 kg)   General Appearance: Well nourished, in no apparent distress. Eyes: PERRLA, EOMs, conjunctiva no swelling or erythema Sinuses: No Frontal/maxillary tenderness ENT/Mouth: Ext aud canals clear, TMs without erythema, bulging. No erythema, swelling, or exudate on post pharynx.  Tonsils not swollen or erythematous. Hearing normal.  Neck: Supple, thyroid normal.  Respiratory: Respiratory  effort normal, BS equal bilaterally without rales, rhonchi, wheezing or stridor.  Cardio: RRR with no MRGs. Brisk peripheral pulses without edema.  Abdomen: Soft, + BS.  Non tender, no guarding, rebound, hernias, masses. Lymphatics: Non tender without lymphadenopathy.  Musculoskeletal: Full ROM, 5/5 strength, Normal gait Skin: Warm, dry without rashes, lesions, ecchymosis.  Neuro: Cranial nerves intact. No cerebellar symptoms.  Psych: Awake and oriented X 3, normal affect, Insight and Judgment appropriate.    [name], NP 11:04 AM [facility]

## 2023-07-13 ENCOUNTER — Ambulatory Visit: Payer: BC Managed Care – PPO | Admitting: Nurse Practitioner

## 2023-07-13 DIAGNOSIS — R7309 Other abnormal glucose: Secondary | ICD-10-CM

## 2023-07-13 DIAGNOSIS — I10 Essential (primary) hypertension: Secondary | ICD-10-CM

## 2023-07-13 DIAGNOSIS — E559 Vitamin D deficiency, unspecified: Secondary | ICD-10-CM

## 2023-07-13 DIAGNOSIS — I714 Abdominal aortic aneurysm, without rupture, unspecified: Secondary | ICD-10-CM

## 2023-07-13 DIAGNOSIS — Z79899 Other long term (current) drug therapy: Secondary | ICD-10-CM

## 2023-07-13 DIAGNOSIS — G43009 Migraine without aura, not intractable, without status migrainosus: Secondary | ICD-10-CM

## 2023-07-13 DIAGNOSIS — E663 Overweight: Secondary | ICD-10-CM

## 2023-07-13 DIAGNOSIS — E782 Mixed hyperlipidemia: Secondary | ICD-10-CM

## 2023-07-15 ENCOUNTER — Encounter: Payer: Self-pay | Admitting: Nurse Practitioner

## 2023-07-15 ENCOUNTER — Ambulatory Visit: Payer: BC Managed Care – PPO | Admitting: Nurse Practitioner

## 2023-07-15 VITALS — BP 112/70 | HR 85 | Temp 97.7°F | Ht 70.0 in | Wt 192.0 lb

## 2023-07-15 DIAGNOSIS — G43009 Migraine without aura, not intractable, without status migrainosus: Secondary | ICD-10-CM | POA: Diagnosis not present

## 2023-07-15 DIAGNOSIS — E782 Mixed hyperlipidemia: Secondary | ICD-10-CM

## 2023-07-15 DIAGNOSIS — I10 Essential (primary) hypertension: Secondary | ICD-10-CM

## 2023-07-15 DIAGNOSIS — I714 Abdominal aortic aneurysm, without rupture, unspecified: Secondary | ICD-10-CM

## 2023-07-15 DIAGNOSIS — Z79899 Other long term (current) drug therapy: Secondary | ICD-10-CM | POA: Diagnosis not present

## 2023-07-15 DIAGNOSIS — R7309 Other abnormal glucose: Secondary | ICD-10-CM

## 2023-07-15 DIAGNOSIS — E663 Overweight: Secondary | ICD-10-CM

## 2023-07-15 DIAGNOSIS — E559 Vitamin D deficiency, unspecified: Secondary | ICD-10-CM

## 2023-07-15 MED ORDER — ROSUVASTATIN CALCIUM 10 MG PO TABS
10.0000 mg | ORAL_TABLET | Freq: Every day | ORAL | 11 refills | Status: AC
Start: 2023-07-15 — End: 2024-07-14

## 2023-07-15 NOTE — Patient Instructions (Signed)

## 2023-07-15 NOTE — Progress Notes (Signed)
FOLLOW UP  Assessment and Plan:   Dale Conley was seen today for migraine.  Diagnoses and all orders for this visit:  Essential hypertension - Currently well controlled without medication -     CBC with Differential/Platelet -  Continue DASH diet, exercise and monitor at home. Call if greater than 130/80.    Mixed hyperlipidemia -     COMPLETE METABOLIC PANEL WITH GFR -     Lipid panel -     TSH - Stressed importance of diet and exercise - Has not taken his Rosuvastatin since last visit- will restart today  Overweight (BMI 25.0-29.9) Long discussion about weight loss, diet, and exercise Recommended diet heavy in fruits and veggies and low in animal meats, cheeses, and dairy products, appropriate calorie intake Patient will work on decreasing saturated fats and simple carbs Follow up at next visit - TSH  Abdominal Aortic Aneurysm Found on 03/2022 Abdominal U/S, repeat U/S is due 2026  Migraine without aura and without status migrainosus, not intractable Monitor migraines,only having infrequently and using Excedrin which provides relief    Abnormal Glucose Continue diet and exercise - CMP  Vitamin D Deficiency Continue Vit D supplementation to maintain value in therapeutic level of 60-100   Medication Management Medications reviewed and Questions answered  -Magnesium   Continue diet and meds as discussed. Further disposition pending results of labs. Discussed med's effects and SE's.   Over 30 minutes of exam, counseling, chart review, and critical decision making was performed.   Future Appointments  Date Time Provider Department Center  09/28/2023  2:00 PM [name], NP [facility] None    ----------------------------------------------------------------------------------------------------------------------  HPI 47 y.o. male  presents for 3 month follow up on hypertension, cholesterol,  weight and vitamin D deficiency. Also having migraines  His headaches  are much better controlled, will use Excedrin occasionally which will relieve headaches.    BMI is Body mass index is 27.55 kg/m., he has not been working on diet and exercise. He hurt his wrist and shoulder so stopped activity for a short period Wt Readings from Last 3 Encounters:  07/15/23 192 lb (87.1 kg)  01/11/23 184 lb 12.8 oz (83.8 kg)  12/11/22 182 lb 9.6 oz (82.8 kg)    His blood pressure has been controlled at home, today their BP is BP: 112/70 BP Readings from Last 3 Encounters:  07/15/23 112/70  01/11/23 128/88  12/11/22 120/82   He does workout. He denies chest pain, shortness of breath, dizziness.   He is prescribed rosuvastatin 10 mg which was started 08/2022, has not taken since 12/2022  His cholesterol is not at goal. The cholesterol last visit was:   Lab Results  Component Value Date   CHOL 196 01/11/2023   HDL 84 01/11/2023   LDLCALC 98 01/11/2023   TRIG 55 01/11/2023   CHOLHDL 2.3 01/11/2023   GERD is much better controlled only takes Omeprazole as needed.   Strattera does help with ADD. Much better with use of medication.   Patient is on Vitamin D supplement.   Lab Results  Component Value Date   VD25OH 37 09/24/2022        Current Medications:  Current Outpatient Medications on File Prior to Visit  Medication Sig   atomoxetine (STRATTERA) 40 MG capsule Take 40 mg by mouth every morning.   omeprazole (PRILOSEC) 40 MG capsule TAKE 1 CAPSULE BY MOUTH DAILY FOR HEARTBURN OR INDIGESTION   tiZANidine (ZANAFLEX) 4 MG tablet TAKE 1 TABLET(4 MG) BY MOUTH  EVERY 6 HOURS AS NEEDED   Vitamin D, Ergocalciferol, (DRISDOL) 1.25 MG (50000 UNIT) CAPS capsule 1 pill 1 days a week for vitamin d deficiency   Multiple Vitamin (MULTIVITAMIN) tablet Take 1 tablet by mouth daily. (Patient not taking: Reported on 07/15/2023)   rosuvastatin (CRESTOR) 10 MG tablet Take 1 tablet (10 mg total) by mouth daily. (Patient not taking: Reported on 07/15/2023)   No current  facility-administered medications on file prior to visit.     Allergies:  Allergies  Allergen Reactions   Flexeril [Cyclobenzaprine] Other (See Comments)    Makes pt groggy     Medical History:  Past Medical History:  Diagnosis Date   Sleep apnea    Family history- Reviewed and unchanged Social history- Reviewed and unchanged   Review of Systems:  Review of Systems  Constitutional:  Negative for chills and fever.  HENT:  Positive for tinnitus. Negative for congestion, hearing loss, sinus pain and sore throat.   Eyes:  Negative for blurred vision.  Respiratory:  Negative for cough and hemoptysis.   Cardiovascular:  Negative for chest pain, palpitations and leg swelling.  Gastrointestinal:  Negative for abdominal pain, diarrhea, heartburn, nausea and vomiting.  Genitourinary:  Negative for dysuria.  Musculoskeletal:  Negative for back pain and joint pain.  Skin: Negative.   Neurological:  Positive for tingling (left lower leg) and headaches (infrequent). Negative for dizziness.  Endo/Heme/Allergies:  Does not bruise/bleed easily.  Psychiatric/Behavioral:  Negative for depression. The patient does not have insomnia.       Physical Exam: BP 112/70   Pulse 85   Temp 97.7 F (36.5 C)   Ht 5\' 10"  (1.778 m)   Wt 192 lb (87.1 kg)   SpO2 97%   BMI 27.55 kg/m  Wt Readings from Last 3 Encounters:  07/15/23 192 lb (87.1 kg)  01/11/23 184 lb 12.8 oz (83.8 kg)  12/11/22 182 lb 9.6 oz (82.8 kg)   General Appearance: Well nourished, in no apparent distress. Eyes: PERRLA, EOMs, conjunctiva no swelling or erythema ENT/Mouth: Ext aud canals clear, TMs without erythema, bulging. No erythema, swelling, or exudate on post pharynx.  Marland Kitchen Hearing normal.  Neck: Supple, thyroid normal.  Respiratory: Respiratory effort normal, BS equal bilaterally without rales, rhonchi, wheezing or stridor.  Cardio: RRR with no MRGs. Brisk peripheral pulses without edema.  Abdomen: Soft, + BS.  Non  tender, no guarding, rebound, hernias, masses. Lymphatics: Non tender without lymphadenopathy.  Musculoskeletal: Full ROM, 5/5 strength, Normal gait Skin: Warm, dry without rashes, lesions, ecchymosis.  Neuro: Cranial nerves intact. No cerebellar symptoms.  Psych: Awake and oriented X 3, normal affect, Insight and Judgment appropriate.    [name], NP 4:24 PM [facility]

## 2023-07-16 LAB — COMPLETE METABOLIC PANEL WITH GFR
AG Ratio: 1.9 (calc) (ref 1.0–2.5)
ALT: 14 U/L (ref 9–46)
AST: 17 U/L (ref 10–40)
Albumin: 4.9 g/dL (ref 3.6–5.1)
Alkaline phosphatase (APISO): 85 U/L (ref 36–130)
BUN: 16 mg/dL (ref 7–25)
CO2: 29 mmol/L (ref 20–32)
Calcium: 9.7 mg/dL (ref 8.6–10.3)
Chloride: 102 mmol/L (ref 98–110)
Creat: 0.97 mg/dL (ref 0.60–1.29)
Globulin: 2.6 g/dL (ref 1.9–3.7)
Glucose, Bld: 80 mg/dL (ref 65–99)
Potassium: 4.2 mmol/L (ref 3.5–5.3)
Sodium: 139 mmol/L (ref 135–146)
Total Bilirubin: 0.4 mg/dL (ref 0.2–1.2)
Total Protein: 7.5 g/dL (ref 6.1–8.1)
eGFR: 98 mL/min/{1.73_m2} (ref >=60)

## 2023-07-16 LAB — CBC WITH DIFFERENTIAL/PLATELET
Absolute Lymphocytes: 3133 {cells}/uL (ref 850–3900)
Absolute Monocytes: 670 {cells}/uL (ref 200–950)
Basophils Absolute: 39 {cells}/uL (ref 0–200)
Basophils Relative: 0.6 %
Eosinophils Absolute: 221 {cells}/uL (ref 15–500)
Eosinophils Relative: 3.4 %
HCT: 46 % (ref 38.5–50.0)
Hemoglobin: 15.5 g/dL (ref 13.2–17.1)
MCH: 31.2 pg (ref 27.0–33.0)
MCHC: 33.7 g/dL (ref 32.0–36.0)
MCV: 92.6 fL (ref 80.0–100.0)
MPV: 9.5 fL (ref 7.5–12.5)
Monocytes Relative: 10.3 %
Neutro Abs: 2438 {cells}/uL (ref 1500–7800)
Neutrophils Relative %: 37.5 %
Platelets: 283 10*3/uL (ref 140–400)
RBC: 4.97 10*6/uL (ref 4.20–5.80)
RDW: 11.9 % (ref 11.0–15.0)
Total Lymphocyte: 48.2 %
WBC: 6.5 10*3/uL (ref 3.8–10.8)

## 2023-07-16 LAB — LIPID PANEL
Cholesterol: 283 mg/dL — ABNORMAL HIGH (ref <200)
HDL: 77 mg/dL (ref >=40)
LDL Cholesterol (Calc): 183 mg/dL — ABNORMAL HIGH
Non-HDL Cholesterol (Calc): 206 mg/dL — ABNORMAL HIGH (ref <130)
Total CHOL/HDL Ratio: 3.7 (calc) (ref <5.0)
Triglycerides: 108 mg/dL (ref <150)

## 2023-07-16 LAB — MAGNESIUM: Magnesium: 2.1 mg/dL (ref 1.5–2.5)

## 2023-09-08 ENCOUNTER — Other Ambulatory Visit: Payer: Self-pay

## 2023-09-08 ENCOUNTER — Encounter: Payer: Self-pay | Admitting: Nurse Practitioner

## 2023-09-08 ENCOUNTER — Ambulatory Visit: Payer: BC Managed Care – PPO | Admitting: Nurse Practitioner

## 2023-09-08 VITALS — BP 112/82 | HR 105 | Temp 97.7°F | Ht 70.0 in | Wt 194.6 lb

## 2023-09-08 DIAGNOSIS — I10 Essential (primary) hypertension: Secondary | ICD-10-CM

## 2023-09-08 DIAGNOSIS — S8992XA Unspecified injury of left lower leg, initial encounter: Secondary | ICD-10-CM

## 2023-09-08 DIAGNOSIS — H6501 Acute serous otitis media, right ear: Secondary | ICD-10-CM

## 2023-09-08 DIAGNOSIS — J029 Acute pharyngitis, unspecified: Secondary | ICD-10-CM

## 2023-09-08 DIAGNOSIS — R6889 Other general symptoms and signs: Secondary | ICD-10-CM

## 2023-09-08 DIAGNOSIS — Z1152 Encounter for screening for COVID-19: Secondary | ICD-10-CM

## 2023-09-08 LAB — POCT RAPID STREP A (OFFICE): Rapid Strep A Screen: NEGATIVE

## 2023-09-08 LAB — POCT INFLUENZA A/B
Influenza A, POC: NEGATIVE
Influenza B, POC: NEGATIVE

## 2023-09-08 LAB — POC COVID19 BINAXNOW: SARS Coronavirus 2 Ag: NEGATIVE

## 2023-09-08 MED ORDER — AMOXICILLIN 500 MG PO TABS: 500.0000 mg | ORAL_TABLET | Freq: Three times a day (TID) | ORAL | 0 refills | Status: AC

## 2023-09-08 NOTE — Progress Notes (Signed)
Assessment and Plan:  Arias was seen today for acute visit.  Diagnoses and all orders for this visit:  Essential hypertension - continue DASH diet, exercise and monitor at home. Call if greater than 130/80.  - Controlled without medication  Encounter for screening for COVID-19 -     POC COVID-19- negative  Flu-like symptoms -     POCT Influenza A/B- negative  Sore throat -     POCT rapid strep A- negative  Right acute serous otitis media, recurrence not specified Push fluids Continue Mucinex Amoxicillin as directed -     amoxicillin (AMOXIL) 500 MG tablet; Take 1 tablet (500 mg total) by mouth 3 (three) times daily. 10 days  Left knee injury, initial encounter Wear brace Use Advil as needed If no improvement in next week notify the office      Further disposition pending results of labs. Discussed med's effects and SE's.   Over 30 minutes of exam, counseling, chart review, and critical decision making was performed.   Future Appointments  Date Time Provider Department Center  09/28/2023  2:00 PM [name], NP [facility] None    ------------------------------------------------------------------------------------------------------------------   HPI BP 112/82   Pulse (!) 105   Temp 97.7 F (36.5 C)   Ht 5\' 10"  (1.778 m)   Wt 194 lb 9.6 oz (88.3 kg)   SpO2 95%   BMI 27.92 kg/m   47 y.o.male presents for complaints of sore throat, fatigue, headache, joint pain, chills that began 09/06/23.  His daughter also has sore throat but has not been to the doctor. Denies coughing and congestion, nausea, vomiting or diarrhea. Used Mucinex yesterday with some relief  He hyper extended his left knee over the weekend. Swelling has decreased still some tenderness with movement.  BP well controlled without medication BP Readings from Last 3 Encounters:  09/08/23 112/82  07/15/23 112/70  01/11/23 128/88  Denies headaches, chest pain, shortness of breath and  dizziness  BMI is Body mass index is 27.92 kg/m., he has not been working on diet and exercise. Wt Readings from Last 3 Encounters:  09/08/23 194 lb 9.6 oz (88.3 kg)  07/15/23 192 lb (87.1 kg)  01/11/23 184 lb 12.8 oz (83.8 kg)     Past Medical History:  Diagnosis Date   Sleep apnea      Allergies  Allergen Reactions   Flexeril [Cyclobenzaprine] Other (See Comments)    Makes pt groggy    Current Outpatient Medications on File Prior to Visit  Medication Sig   atomoxetine (STRATTERA) 40 MG capsule Take 40 mg by mouth every morning.   omeprazole (PRILOSEC) 40 MG capsule TAKE 1 CAPSULE BY MOUTH DAILY FOR HEARTBURN OR INDIGESTION   rosuvastatin (CRESTOR) 10 MG tablet Take 1 tablet (10 mg total) by mouth daily.   tiZANidine (ZANAFLEX) 4 MG tablet TAKE 1 TABLET(4 MG) BY MOUTH EVERY 6 HOURS AS NEEDED   Vitamin D, Ergocalciferol, (DRISDOL) 1.25 MG (50000 UNIT) CAPS capsule 1 pill 1 days a week for vitamin d deficiency   Multiple Vitamin (MULTIVITAMIN) tablet Take 1 tablet by mouth daily. (Patient not taking: Reported on 07/15/2023)   No current facility-administered medications on file prior to visit.    ROS: all negative except above.   Physical Exam:  BP 112/82   Pulse (!) 105   Temp 97.7 F (36.5 C)   Ht 5\' 10"  (1.778 m)   Wt 194 lb 9.6 oz (88.3 kg)   SpO2 95%   BMI 27.92 kg/m  General Appearance: Well nourished, in no apparent distress. Eyes: PERRLA, EOMs, conjunctiva no swelling or erythema Sinuses: No Frontal/maxillary tenderness ENT/Mouth: Ext aud canals clear, L TM without erythema, bulging. R TM erythema and serous drainage, bulging. No erythema, swelling, or exudate on post pharynx.  Hearing normal.  Neck: Supple, thyroid normal.  Respiratory: Respiratory effort normal, BS equal bilaterally without rales, rhonchi, wheezing or stridor.  Cardio: RRR with no MRGs. Brisk peripheral pulses without edema.  Abdomen: Soft, + BS.  Non tender, no guarding, rebound,  hernias, masses. Lymphatics: Non tender without lymphadenopathy.  Musculoskeletal: Full ROM, 5/5 strength, normal gait. Left knee pain with lateral movement Skin: Warm, dry without rashes, lesions, ecchymosis.  Neuro: Cranial nerves intact. Normal muscle tone, no cerebellar symptoms. Sensation intact.  Psych: Awake and oriented X 3, normal affect, Insight and Judgment appropriate.     [name], NP 1:59 PM [facility]

## 2023-09-08 NOTE — Patient Instructions (Signed)
 Otitis Media, Adult    Otitis media is a condition in which the middle ear is red and swollen (inflamed) and full of fluid. The middle ear is the part of the ear that contains bones for hearing as well as air that helps send sounds to the brain. The condition usually goes away on its own.  What are the causes?  This condition is caused by a blockage in the eustachian tube. This tube connects the middle ear to the back of the nose. It normally allows air into the middle ear. The blockage is caused by fluid or swelling. Problems that can cause blockage include:  A cold or infection that affects the nose, mouth, or throat.  Allergies.  An irritant, such as tobacco smoke.  Adenoids that have become large. The adenoids are soft tissue located in the back of the throat, behind the nose and the roof of the mouth.  Growth or swelling in the upper part of the throat, just behind the nose (nasopharynx).  Damage to the ear caused by a change in pressure. This is called barotrauma.  What increases the risk?  You are more likely to develop this condition if you:  Smoke or are exposed to tobacco smoke.  Have an opening in the roof of your mouth (cleft palate).  Have acid reflux.  Have problems in your body's defense system (immune system).  What are the signs or symptoms?  Symptoms of this condition include:  Ear pain.  Fever.  Problems with hearing.  Being tired.  Fluid leaking from the ear.  Ringing in the ear.  How is this treated?  This condition can go away on its own within 3-5 days. But if the condition is caused by germs (bacteria) and does not go away on its own, or if it keeps coming back, your doctor may:  Give you antibiotic medicines.  Give you medicines for pain.  Follow these instructions at home:  Take over-the-counter and prescription medicines only as told by your doctor.  If you were prescribed an antibiotic medicine, take it as told by your doctor. Do not stop taking it even if you start to feel better.  Keep  all follow-up visits.  Contact a doctor if:  You have bleeding from your nose.  There is a lump on your neck.  You are not feeling better in 5 days.  You feel worse instead of better.  Get help right away if:  You have pain that is not helped with medicine.  You have swelling, redness, or pain around your ear.  You get a stiff neck.  You cannot move part of your face (paralysis).  You notice that the bone behind your ear hurts when you touch it.  You get a very bad headache.  Summary  Otitis media means that the middle ear is red, swollen, and full of fluid.  This condition usually goes away on its own.  If the problem does not go away, treatment may be needed. You may be given medicines to treat the infection or to treat your pain.  If you were prescribed an antibiotic medicine, take it as told by your doctor. Do not stop taking it even if you start to feel better.  Keep all follow-up visits.  This information is not intended to replace advice given to you by your health care provider. Make sure you discuss any questions you have with your health care provider.  Document Revised: 12/23/2020 Document Reviewed: 12/23/2020  Elsevier  Patient Education  2024 [employer].

## 2023-09-27 NOTE — Progress Notes (Deleted)
 Complete Physical  Assessment and Plan:  Dale Conley was seen today for annual exam. Diagnoses and all orders for this visit:  Encounter for general Adult medical Examination with Abnormal Findings Due Annually  Abdominal Aortic Aneurysm(HCC) Discovered on ABD U/S 04/01/22 To have follow up U/S 2026 Control Blood pressure, cholesterol, blood sugars and weight  Essential hypertension No medications Monitor blood pressure at home; call if consistently over 130/80 Continue DASH diet.   Reminder to go to the ER if any CP, SOB, nausea, dizziness, severe HA, changes vision/speech, left arm numbness and tingling and jaw pain.  Mixed hyperlipidemia Start Rosuvastatin  10 mg daily Discussed dietary and exercise modifications Low fat diet  Migraine Have improved with decreasing frequency of use of Methocarbomol and Mobic  Monitor  Elevated LFT's Limit alcohol and Tylenol  - CMP  Abnormal glucose Continue diet and exercise - A1c  Hypogonadism in male No supplementation at this time Exercising more Recommend Zinc 50 mg QD  Overweight (BMI 25.0-29.9) Discussed dietary and exercise modifications Decrease saturated fats and simple carbs  Vitamin D  deficiency Continue supplementation Will monitor next routine visit  Medication management Continued  OSA on CPAP Has only been using his CPAP probably 10% of the time Stressed importance of use and will try using nightly as his New Years Resolution  Screening for prostate cancer - PSA  Screening, ischemic heart disease -EKG  Screening for blood or protein in urine -Routine urine with Reflex microscopic -Microalbumin/creatinine urine ratio  Screening for thyroid  disorder -TSH  Flu Vaccine Need Flu Vaccine Quad 6+ MOS PF IM given  Patient agrees with plan of care.  Discussed when to call or return and hospital precautions. Further disposition pending results of labs. Discussed med's effects and SE's.   Over 30 minutes of  face to face interview, exam, counseling, chart review, and critical decision making was performed.   Future Appointments  Date Time Provider Department Center  09/28/2023  2:00 PM Peighton Mehra E, NP [facility] None  09/26/2024  2:00 PM Alexzavier Girardin E, NP [facility] None    ------------------------------------------------------------------------------------------------------------------   HPI 47 y.o.male presents for complete physical. has Back pain; Hypogonadism in male; Essential hypertension; Vitamin D  deficiency; Medication management; Obstructive sleep apnea syndrome; Headache; Overweight (BMI 25.0-29.9); Hyperlipidemia; Elevated LFTs; AAA (abdominal aortic aneurysm) without rupture (HCC); and Pain in left wrist on their problem list.   Migraines are occurring very infrequently.  Will occasionally have a stress headache.   He had a sleep study and recommend CPAP from [facility]. He is on the nose piece, can tolerate for about 4 hours a night. Has not been using every night- only using 10% of time.   BMI is There is no height or weight on file to calculate BMI., he has been working on diet and exercise. Weights and cardio. Started dieting 06/26/22 Wt Readings from Last 3 Encounters:  09/08/23 194 lb 9.6 oz (88.3 kg)  07/15/23 192 lb (87.1 kg)  01/11/23 184 lb 12.8 oz (83.8 kg)     His blood pressure has been controlled at home, today their BP is   BP Readings from Last 3 Encounters:  09/08/23 112/82  07/15/23 112/70  01/11/23 128/88     He does workout. He denies chest pain, shortness of breath, dizziness.  He is not on cholesterol medication and denies myalgias. His cholesterol is not at goal. The cholesterol last visit was:   Lab Results  Component Value Date   CHOL 283 (H) 07/15/2023   HDL 77 07/15/2023  LDLCALC 183 (H) 07/15/2023   TRIG 108 07/15/2023   CHOLHDL 3.7 07/15/2023    He has been working on diet and exercise for prediabetes, and denies polydipsia,  polyuria, visual disturbances, vomiting and weight loss. Last A1C in the office was:  Lab Results  Component Value Date   HGBA1C 5.5 09/24/2022   Patient is on Vitamin D  supplement.   Lab Results  Component Value Date   VD25OH 37 09/24/2022   He has a history of testosterone  deficiency, it not on anything, states it makes him irritable.  Lab Results  Component Value Date   TESTOSTERONE  238 (L) 09/22/2018   Lab Results  Component Value Date   EGFR 98 07/15/2023   Lab Results  Component Value Date   ALT 14 07/15/2023   AST 17 07/15/2023   ALKPHOS 57 12/23/2020   BILITOT 0.4 07/15/2023     Names of Other Physician/Practitioners you currently use: 1. [facility] here for primary care 2.  Eye Exam: Has not had 3. Dentist: Declines relate dot COVID19  Patient Care Team: Tonita Fallow, MD as PCP - General (Internal Medicine)    Screening Tests: Immunization History  Administered Date(s) Administered   Influenza Inj Mdck Quad With Preservative 07/19/2019, 09/24/2020   Influenza, Mdck, Trivalent,PF 6+ MOS(egg free) 06/14/2023   Influenza,inj,Quad PF,6+ Mos 09/24/2021, 09/24/2022   PFIZER(Purple Top)SARS-COV-2 Vaccination 12/22/2019, 01/12/2020, 07/29/2020    Preventative care: Last colonoscopy: N/A   Vaccinations: TD or Tdap: 2015 Influenza: Pneumococcal: N/A Prevnar13: N/A  Shingles/Zostavax: N/A SARs-COV2[place] 07/2020    Past Medical History:  Diagnosis Date   Sleep apnea      Allergies  Allergen Reactions   Flexeril [Cyclobenzaprine] Other (See Comments)    Makes pt groggy    Current Outpatient Medications on File Prior to Visit  Medication Sig   amoxicillin  (AMOXIL ) 500 MG tablet Take 1 tablet (500 mg total) by mouth 3 (three) times daily. 10 days   atomoxetine (STRATTERA) 40 MG capsule Take 40 mg by mouth every morning.   omeprazole  (PRILOSEC) 40 MG capsule TAKE 1 CAPSULE BY MOUTH DAILY FOR  HEARTBURN OR INDIGESTION   rosuvastatin  (CRESTOR ) 10 MG tablet Take 1 tablet (10 mg total) by mouth daily.   tiZANidine  (ZANAFLEX ) 4 MG tablet TAKE 1 TABLET(4 MG) BY MOUTH EVERY 6 HOURS AS NEEDED   Vitamin D , Ergocalciferol , (DRISDOL ) 1.25 MG (50000 UNIT) CAPS capsule 1 pill 1 days a week for vitamin d  deficiency   No current facility-administered medications on file prior to visit.    ROS: Review of Systems  Constitutional:  Negative for chills and fever.  HENT:  Negative for congestion, hearing loss, sinus pain, sore throat and tinnitus.   Eyes:  Negative for blurred vision and double vision.  Respiratory:  Negative for cough, hemoptysis, sputum production, shortness of breath and wheezing.   Cardiovascular:  Negative for chest pain, palpitations and leg swelling.  Gastrointestinal:  Negative for abdominal pain, constipation, diarrhea, heartburn, nausea and vomiting.  Genitourinary:  Negative for dysuria and urgency.  Musculoskeletal:  Positive for back pain. Negative for falls, joint pain, myalgias and neck pain.  Skin:  Negative for rash.  Neurological:  Positive for headaches (not as frequent). Negative for dizziness, tingling, tremors and weakness.  Endo/Heme/Allergies:  Does not bruise/bleed easily.  Psychiatric/Behavioral:  Negative for depression and suicidal ideas. The patient is not nervous/anxious and does not have insomnia.     Physical Exam:  There were no vitals  taken for this visit.  General Appearance: Well nourished, in no apparent distress. Eyes: PERRLA, EOMs, conjunctiva no swelling or erythema  Sinuses: No Frontal/maxillary tenderness ENT/Mouth: Ext aud canals clear, TMs without erythema, bulging. No erythema, swelling, or exudate on post pharynx.  Tonsils not swollen or erythematous. Hearing normal.  Neck: Supple, thyroid  normal.  Respiratory: Respiratory effort normal, BS equal bilaterally without rales, rhonchi, wheezing or stridor.  Cardio: RRR with no MRGs.  Brisk peripheral pulses without edema.  Abdomen: Soft, + BS.  Non tender, no guarding, rebound, hernias, masses. Lymphatics: Non tender without lymphadenopathy.  Musculoskeletal: Full ROM, 5/5 strength, normal gait. Pain with bending at waist.  Skin: Warm, dry without rashes, lesions, ecchymosis.  Neuro: Cranial nerves intact. Normal muscle tone, no cerebellar symptoms. Sensation intact.  Psych: Awake and oriented X 3, normal affect, Insight and Judgment appropriate.    EKG: NSR, no ST changes  Lonell MICAEL Maryln [facility]  09/27/2023

## 2023-09-28 ENCOUNTER — Encounter: Payer: BC Managed Care – PPO | Admitting: Nurse Practitioner

## 2023-09-28 DIAGNOSIS — E559 Vitamin D deficiency, unspecified: Secondary | ICD-10-CM

## 2023-09-28 DIAGNOSIS — E782 Mixed hyperlipidemia: Secondary | ICD-10-CM

## 2023-09-28 DIAGNOSIS — I10 Essential (primary) hypertension: Secondary | ICD-10-CM

## 2023-09-28 DIAGNOSIS — Z0001 Encounter for general adult medical examination with abnormal findings: Secondary | ICD-10-CM

## 2023-09-28 DIAGNOSIS — Z125 Encounter for screening for malignant neoplasm of prostate: Secondary | ICD-10-CM

## 2023-09-28 DIAGNOSIS — Z1389 Encounter for screening for other disorder: Secondary | ICD-10-CM

## 2023-09-28 DIAGNOSIS — R7989 Other specified abnormal findings of blood chemistry: Secondary | ICD-10-CM

## 2023-09-28 DIAGNOSIS — Z79899 Other long term (current) drug therapy: Secondary | ICD-10-CM

## 2023-09-28 DIAGNOSIS — I714 Abdominal aortic aneurysm, without rupture, unspecified: Secondary | ICD-10-CM

## 2023-09-28 DIAGNOSIS — E291 Testicular hypofunction: Secondary | ICD-10-CM

## 2023-09-28 DIAGNOSIS — Z136 Encounter for screening for cardiovascular disorders: Secondary | ICD-10-CM

## 2023-09-28 DIAGNOSIS — Z1329 Encounter for screening for other suspected endocrine disorder: Secondary | ICD-10-CM

## 2023-09-28 DIAGNOSIS — E663 Overweight: Secondary | ICD-10-CM

## 2023-09-28 DIAGNOSIS — R7309 Other abnormal glucose: Secondary | ICD-10-CM

## 2023-09-28 DIAGNOSIS — G4733 Obstructive sleep apnea (adult) (pediatric): Secondary | ICD-10-CM

## 2023-10-06 DIAGNOSIS — F902 Attention-deficit hyperactivity disorder, combined type: Secondary | ICD-10-CM | POA: Diagnosis not present

## 2023-10-27 ENCOUNTER — Encounter: Payer: BC Managed Care – PPO | Admitting: Nurse Practitioner

## 2024-03-20 DIAGNOSIS — Z1211 Encounter for screening for malignant neoplasm of colon: Secondary | ICD-10-CM | POA: Diagnosis not present

## 2024-03-20 DIAGNOSIS — T148XXA Other injury of unspecified body region, initial encounter: Secondary | ICD-10-CM | POA: Diagnosis not present

## 2024-04-15 DIAGNOSIS — S61206A Unspecified open wound of right little finger without damage to nail, initial encounter: Secondary | ICD-10-CM | POA: Diagnosis not present

## 2024-05-02 DIAGNOSIS — M5432 Sciatica, left side: Secondary | ICD-10-CM | POA: Diagnosis not present

## 2024-05-02 DIAGNOSIS — M4723 Other spondylosis with radiculopathy, cervicothoracic region: Secondary | ICD-10-CM | POA: Diagnosis not present

## 2024-05-02 DIAGNOSIS — M5442 Lumbago with sciatica, left side: Secondary | ICD-10-CM | POA: Diagnosis not present

## 2024-05-02 DIAGNOSIS — M9901 Segmental and somatic dysfunction of cervical region: Secondary | ICD-10-CM | POA: Diagnosis not present

## 2024-05-24 DIAGNOSIS — F4322 Adjustment disorder with anxiety: Secondary | ICD-10-CM | POA: Diagnosis not present

## 2024-06-01 DIAGNOSIS — F4322 Adjustment disorder with anxiety: Secondary | ICD-10-CM | POA: Diagnosis not present

## 2024-06-28 DIAGNOSIS — F902 Attention-deficit hyperactivity disorder, combined type: Secondary | ICD-10-CM | POA: Diagnosis not present

## 2024-07-16 ENCOUNTER — Other Ambulatory Visit: Payer: Self-pay | Admitting: Nurse Practitioner

## 2024-07-16 DIAGNOSIS — E782 Mixed hyperlipidemia: Secondary | ICD-10-CM

## 2024-09-26 ENCOUNTER — Encounter: Payer: BC Managed Care – PPO | Admitting: Nurse Practitioner
